# Patient Record
Sex: Female | Born: 1960 | Race: Black or African American | Hispanic: No | Marital: Single | Smoking: Current every day smoker
Health system: Southern US, Community
[De-identification: ages and names within clinical notes are randomized; demographics above are authoritative.]

## PROBLEM LIST (undated history)

## (undated) DIAGNOSIS — E119 Type 2 diabetes mellitus without complications: Secondary | ICD-10-CM

## (undated) DIAGNOSIS — I10 Essential (primary) hypertension: Secondary | ICD-10-CM

## (undated) DIAGNOSIS — K219 Gastro-esophageal reflux disease without esophagitis: Secondary | ICD-10-CM

## (undated) DIAGNOSIS — M1990 Unspecified osteoarthritis, unspecified site: Secondary | ICD-10-CM

## (undated) HISTORY — PX: CHOLECYSTECTOMY: SHX55

## (undated) HISTORY — PX: BACK SURGERY: SHX140

---

## 2014-04-24 NOTE — Progress Notes (Signed)
Stacey Peacerew Perkins, Stacey [name]   -  Please [facility] preop orders in Epic [name] - [facility] on 7/[facility].  Thanks.

## 2014-04-26 ENCOUNTER — Encounter (HOSPITAL_COMMUNITY): Payer: Self-pay | Admitting: Pharmacy Technician

## 2014-04-27 ENCOUNTER — Other Ambulatory Visit: Payer: Self-pay | Admitting: Orthopedic Surgery

## 2014-05-03 ENCOUNTER — Encounter (INDEPENDENT_AMBULATORY_CARE_PROVIDER_SITE_OTHER): Payer: Self-pay

## 2014-05-03 ENCOUNTER — Ambulatory Visit (HOSPITAL_COMMUNITY)
Admission: RE | Admit: 2014-05-03 | Discharge: 2014-05-03 | Disposition: A | Discharge status: Home / Self Care | Payer: Managed Care, Other (non HMO) | Source: Ambulatory Visit | Attending: Orthopedic Surgery | Admitting: Orthopedic Surgery

## 2014-05-03 ENCOUNTER — Encounter (HOSPITAL_COMMUNITY): Payer: Self-pay

## 2014-05-03 ENCOUNTER — Encounter (HOSPITAL_COMMUNITY)
Admission: RE | Admit: 2014-05-03 | Discharge: 2014-05-03 | Disposition: A | Discharge status: Home / Self Care | Payer: Managed Care, Other (non HMO) | Source: Ambulatory Visit | Attending: Orthopedic Surgery | Admitting: Orthopedic Surgery

## 2014-05-03 DIAGNOSIS — Z981 Arthrodesis status: Secondary | ICD-10-CM | POA: Insufficient documentation

## 2014-05-03 DIAGNOSIS — M1610 Unilateral primary osteoarthritis, unspecified hip: Secondary | ICD-10-CM | POA: Insufficient documentation

## 2014-05-03 DIAGNOSIS — Z01818 Encounter for other preprocedural examination: Secondary | ICD-10-CM | POA: Insufficient documentation

## 2014-05-03 DIAGNOSIS — F17200 Nicotine dependence, unspecified, uncomplicated: Secondary | ICD-10-CM | POA: Insufficient documentation

## 2014-05-03 DIAGNOSIS — M169 Osteoarthritis of hip, unspecified: Secondary | ICD-10-CM | POA: Insufficient documentation

## 2014-05-03 DIAGNOSIS — Z01812 Encounter for preprocedural laboratory examination: Secondary | ICD-10-CM | POA: Insufficient documentation

## 2014-05-03 DIAGNOSIS — Z01810 Encounter for preprocedural cardiovascular examination: Secondary | ICD-10-CM | POA: Insufficient documentation

## 2014-05-03 HISTORY — DX: Gastro-esophageal reflux disease without esophagitis: K21.9

## 2014-05-03 HISTORY — DX: Type 2 diabetes mellitus without complications: E11.9

## 2014-05-03 HISTORY — DX: Unspecified osteoarthritis, unspecified site: M19.90

## 2014-05-03 HISTORY — DX: Essential (primary) hypertension: I10

## 2014-05-03 LAB — URINALYSIS, ROUTINE W REFLEX MICROSCOPIC
Bilirubin Urine: NEGATIVE
GLUCOSE, UA: 100 mg/dL — AB
HGB URINE DIPSTICK: NEGATIVE
Ketones, ur: NEGATIVE mg/dL
LEUKOCYTES UA: NEGATIVE
Nitrite: NEGATIVE
Protein, ur: 30 mg/dL — AB
Specific Gravity, Urine: 1.025 (ref 1.005–1.030)
Urobilinogen, UA: 0.2 mg/dL (ref 0.0–1.0)
pH: 7 (ref 5.0–8.0)

## 2014-05-03 LAB — PROTIME-INR
INR: 1.03 (ref 0.00–1.49)
Prothrombin Time: 13.5 seconds (ref 11.6–15.2)

## 2014-05-03 LAB — COMPREHENSIVE METABOLIC PANEL
ALT: 9 U/L (ref 0–35)
AST: 14 U/L (ref 0–37)
Albumin: 3.7 g/dL (ref 3.5–5.2)
Alkaline Phosphatase: 169 U/L — ABNORMAL HIGH (ref 39–117)
Anion gap: 13 (ref 5–15)
BILIRUBIN TOTAL: 0.3 mg/dL (ref 0.3–1.2)
BUN: 10 mg/dL (ref 6–23)
CHLORIDE: 102 meq/L (ref 96–112)
CO2: 24 mEq/L (ref 19–32)
Calcium: 9.5 mg/dL (ref 8.4–10.5)
Creatinine, Ser: 0.68 mg/dL (ref 0.50–1.10)
GFR calc Af Amer: >90 mL/min (ref >90)
GFR calc non Af Amer: >90 mL/min (ref >90)
GLUCOSE: 197 mg/dL — AB (ref 70–99)
Potassium: 3.8 mEq/L (ref 3.7–5.3)
SODIUM: 139 meq/L (ref 137–147)
TOTAL PROTEIN: 8.6 g/dL — AB (ref 6.0–8.3)

## 2014-05-03 LAB — SURGICAL PCR SCREEN
MRSA, PCR: NEGATIVE
Staphylococcus aureus: POSITIVE — AB

## 2014-05-03 LAB — URINE MICROSCOPIC-ADD ON

## 2014-05-03 LAB — CBC
HEMATOCRIT: 37 % (ref 36.0–46.0)
HEMOGLOBIN: 12.8 g/dL (ref 12.0–15.0)
MCH: 29.7 pg (ref 26.0–34.0)
MCHC: 34.6 g/dL (ref 30.0–36.0)
MCV: 85.8 fL (ref 78.0–100.0)
Platelets: 341 10*3/uL (ref 150–400)
RBC: 4.31 MIL/uL (ref 3.87–5.11)
RDW: 14 % (ref 11.5–15.5)
WBC: 6.9 10*3/uL (ref 4.0–10.5)

## 2014-05-03 LAB — APTT: APTT: 26 s (ref 24–37)

## 2014-05-03 NOTE — Patient Instructions (Signed)
Stacey [name]  05/03/2014   Your procedure is scheduled on:  05/10/2014  1145am-130pm  [facility].  Follow the Signs to Short Stay Center at  0945      am  Call this number if you have problems the morning of surgery: [phone]   Remember:   Do not eat food or drink liquids after midnight.   Take these medicines the morning of surgery with A SIP OF WATER:    Do not wear jewelry, make-up or nail polish.  Do not wear lotions, powders, or perfumes. , deodorant   Do not shave 48 hours prior to surgery.   Do not bring valuables to the hospital.  Contacts, dentures or bridgework may not be worn into surgery.  Leave suitcase in the car. After surgery it may be brought to your room.  For patients admitted to the hospital, checkout time is 11:00 AM the day of  discharge.   [place] - Preparing for Surgery Before surgery, you can play an important role.  Because skin is not sterile, your skin needs to be as free of germs as possible.  You can reduce the number of germs on your skin by washing with CHG (chlorahexidine gluconate) soap before surgery.  CHG is an antiseptic cleaner which kills germs and bonds with the skin to continue killing germs even after washing. Please DO NOT use if you have an allergy to CHG or antibacterial soaps.  If your skin becomes reddened/irritated stop using the CHG and inform your nurse when you arrive at Short Stay. Do not shave (including legs and underarms) for at least 48 hours prior to the first CHG shower.  You may shave your face/neck. Please follow these instructions carefully:  1.  Shower with CHG Soap the night before surgery and the  morning of Surgery.  2.  If you choose to wash your hair, wash your hair first as usual with your  normal  shampoo.  3.  After you shampoo, rinse your hair and body thoroughly to remove the  shampoo.                           4.  Use CHG as you would any other liquid soap.  You can apply chg directly  to the  skin and wash                       Gently with a scrungie or clean washcloth.  5.  Apply the CHG Soap to your body ONLY FROM THE NECK DOWN.   Do not use on face/ open                           Wound or open sores. Avoid contact with eyes, ears mouth and genitals (private parts).                       Wash face,  Genitals (private parts) with your normal soap.             6.  Wash thoroughly, paying special attention to the area where your surgery  will be performed.  7.  Thoroughly rinse your body with warm water from the neck down.  8.  DO NOT shower/wash with your normal soap after using and rinsing off  the CHG Soap.  9.  Pat yourself dry with a clean towel.            10.  Wear clean pajamas.            11.  Place clean sheets on your bed the night of your first shower and do not  sleep with pets. Day of Surgery : Do not apply any lotions/deodorants the morning of surgery.  Please wear clean clothes to the hospital/surgery center.  FAILURE TO FOLLOW THESE INSTRUCTIONS MAY RESULT IN THE CANCELLATION OF YOUR SURGERY PATIENT SIGNATURE_________________________________  NURSE SIGNATURE__________________________________  ________________________________________________________________________  WHAT IS A BLOOD TRANSFUSION? Blood Transfusion Information  A transfusion is the replacement of blood or some of its parts. Blood is made up of multiple cells which provide different functions.  Red blood cells carry oxygen and are used for blood loss replacement.  White blood cells fight against infection.  Platelets control bleeding.  Plasma helps clot blood.  Other blood products are available for specialized needs, such as hemophilia or other clotting disorders. BEFORE THE TRANSFUSION  Who gives blood for transfusions?   Healthy volunteers who are fully evaluated to make sure their blood is safe. This is blood bank blood. Transfusion therapy is the safest it has ever been  in the practice of medicine. Before blood is taken from a donor, a complete history is taken to make sure that person has no history of diseases nor engages in risky social behavior (examples are intravenous drug use or sexual activity with multiple partners). The donor's travel history is screened to minimize risk of transmitting infections, such as malaria. The donated blood is tested for signs of infectious diseases, such as HIV and hepatitis. The blood is then tested to be sure it is compatible with you in order to minimize the chance of a transfusion reaction. If you or a relative donates blood, this is often done in anticipation of surgery and is not appropriate for emergency situations. It takes many days to process the donated blood. RISKS AND COMPLICATIONS Although transfusion therapy is very safe and saves many lives, the main dangers of transfusion include:   Getting an infectious disease.  Developing a transfusion reaction. This is an allergic reaction to something in the blood you were given. Every precaution is taken to prevent this. The decision to have a blood transfusion has been considered carefully by your caregiver before blood is given. Blood is not given unless the benefits outweigh the risks. AFTER THE TRANSFUSION  Right after receiving a blood transfusion, you will usually feel much better and more energetic. This is especially true if your red blood cells have gotten low (anemic). The transfusion raises the level of the red blood cells which carry oxygen, and this usually causes an energy increase.  The nurse administering the transfusion will monitor you carefully for complications. HOME CARE INSTRUCTIONS  No special instructions are needed after a transfusion. You may find your energy is better. Speak with your caregiver about any limitations on activity for underlying diseases you may have. SEEK MEDICAL CARE IF:   Your condition is not improving after your  transfusion.  You develop redness or irritation at the intravenous (IV) site. SEEK IMMEDIATE MEDICAL CARE IF:  Any of the following symptoms occur over the next 12 hours:  Shaking chills.  You have a temperature by mouth above 102 F (38.9 C), not controlled by medicine.  Chest, back, or muscle pain.  People around you feel you are not acting correctly or  are confused.  Shortness of breath or difficulty breathing.  Dizziness and fainting.  You get a rash or develop hives.  You have a decrease in urine output.  Your urine turns a dark color or changes to pink, red, or brown. Any of the following symptoms occur over the next 10 days:  You have a temperature by mouth above 102 F (38.9 C), not controlled by medicine.  Shortness of breath.  Weakness after normal activity.  The white part of the eye turns yellow (jaundice).  You have a decrease in the amount of urine or are urinating less often.  Your urine turns a dark color or changes to pink, red, or brown. Document Released: 09/26/2000 Document Revised: 12/22/2011 Document Reviewed: 05/15/2008 ExitCare Patient Information 2014 [place].  _______________________________________________________________________  Incentive Spirometer  An incentive spirometer is a tool that can help keep your lungs clear and active. This tool measures how well you are filling your lungs with each breath. Taking long deep breaths may help reverse or decrease the chance of developing breathing (pulmonary) problems (especially infection) following:  A long period of time when you are unable to move or be active. BEFORE THE PROCEDURE   If the spirometer includes an indicator to show your best effort, your nurse or respiratory therapist will set it to a desired goal.  If possible, sit up straight or lean slightly forward. Try not to slouch.  Hold the incentive spirometer in an upright position. INSTRUCTIONS FOR USE  1. Sit on the  edge of your bed if possible, or sit up as far as you can in bed or on a chair. 2. Hold the incentive spirometer in an upright position. 3. Breathe out normally. 4. Place the mouthpiece in your mouth and seal your lips tightly around it. 5. Breathe in slowly and as deeply as possible, raising the piston or the ball toward the top of the column. 6. Hold your breath for 3-5 seconds or for as long as possible. Allow the piston or ball to fall to the bottom of the column. 7. Remove the mouthpiece from your mouth and breathe out normally. 8. Rest for a few seconds and repeat Steps 1 through 7 at least 10 times every 1-2 hours when you are awake. Take your time and take a few normal breaths between deep breaths. 9. The spirometer may include an indicator to show your best effort. Use the indicator as a goal to work toward during each repetition. 10. After each set of 10 deep breaths, practice coughing to be sure your lungs are clear. If you have an incision (the cut made at the time of surgery), support your incision when coughing by placing a pillow or rolled up towels firmly against it. Once you are able to get out of bed, walk around indoors and cough well. You may stop using the incentive spirometer when instructed by your caregiver.  RISKS AND COMPLICATIONS  Take your time so you do not get dizzy or light-headed.  If you are in pain, you may need to take or ask for pain medication before doing incentive spirometry. It is harder to take a deep breath if you are having pain. AFTER USE  Rest and breathe slowly and easily.  It can be helpful to keep track of a log of your progress. Your caregiver can provide you with a simple table to help with this. If you are using the spirometer at home, follow these instructions: [place] IF:  You are having difficultly using the spirometer.  You have trouble using the spirometer as often as instructed.  Your pain medication is not giving enough  relief while using the spirometer.  You develop fever of 100.5 F (38.1 C) or higher. SEEK IMMEDIATE MEDICAL CARE IF:   You cough up bloody sputum that had not been present before.  You develop fever of 102 F (38.9 C) or greater.  You develop worsening pain at or near the incision site. MAKE SURE YOU:   Understand these instructions.  Will watch your condition.  Will get help right away if you are not doing well or get worse. Document Released: 02/09/2007 Document Revised: 12/22/2011 Document Reviewed: 04/12/2007 ExitCare Patient Information 2014 ExitCare, [place].   ________________________________________________________________________     Please read over the following fact sheets that you were given: MRSA Information, coughing and deep breathing exercises, leg exercises

## 2014-05-03 NOTE — Progress Notes (Signed)
Clearance note on chart from Dr Michel SanteeFavero.

## 2014-05-09 ENCOUNTER — Other Ambulatory Visit: Payer: Self-pay | Admitting: Orthopedic Surgery

## 2014-05-09 NOTE — H&P (Signed)
Stacey [name] DOB: 02-09-1961 Single / Language: [place] / Race: Black or African American, White Female Date of Admission:  05/10/2014 History of Present Illness  The patient is a 53 year old female who comes in for a preoperative history and physical. The patient is scheduled for a right total hip arthroplasty (anterior approach) to be performed by Dr. Gus Rankin. Aluisio, MD at [facility] on 05-10-2014. The patient is a 53 year old female who presents with a hip problem. The patient is here today for a second opinion.The patient reports right hip problems including pain symptoms that have been present for 1 year(s). The symptoms began without any known injury. Prior to being seen today the patient was previously evaluated by a colleague 6 month(s) ago (approximately). Symptoms present at the patient's previous evaluation included pain in the hip. Previous workup for this problem has included hip x-rays. Note for "Hip problem": She said she was told she needed a total hip replacement, so she decided to come here for another opinion. Stacey [name] comes in  for a second opinion for her right hip and thigh pain. She states her pain started gradual aobut one year ago. It has been progressive in nature which brought her to her first evaluation. She was told at that time she would need a total hip replacement. She wanted a second opinion and was referred to out clinic by a friend of a friend that is a patient here. The hip has been getting wores with time forcing her to compensate over to her left leg. The pain is noted in the thigh and groin region. She feels some popping at times when she is up on it. She will occassionlay get some numbness but not all the time. She finds that it is more difficult walking and staying on her feet for the whole day while at work. 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 She describes more difficulty getting up  and down steps, getting in and out of the car, and putting on shoes and socks noting some loss of motion in the hip. She experiences sedintary stiffness and it takes her several steps to get moving after sitting for a while. She has pain when up walking but also pain at rest and pain at night. She has used ice and heat with some benefit. She will get some partial relief with oral ibuprofen. She was seen as a second opinion patient. The hip is now hurting her at all times. It is limiting what she can and cannot do. She still works in a standing position for long hours on a hard surface. It is getting harder to work because of the pain. She has pain that keeps her awake. She has had significant limitations in function. She cannot do what she desires. All of this has gotten much worse over the past year. She did not have any injury which has led to this. She is ready to proceed with the hip replacment. They have been treated conservatively in the past for the above stated problem and despite conservative measures, they continue to have progressive pain and severe functional limitations and dysfunction. They have failed non-operative management including home exercise, medications. It is felt that they would benefit from undergoing total joint replacement. Risks and benefits of the procedure have been discussed with the patient and they elect to proceed with surgery. There are no active  contraindications to surgery such as ongoing infection or rapidly progressive neurological disease.   Allergies  No Known Drug Allergies  Problem List/Past Medical Osteoarthritis of right hip, unspecified osteoarthritis type (715.95  M16.11) Rheumatoid Arthritis High blood pressure Hypercholesterolemia Peripheral Neuropathy Diabetes Mellitus, Type II Gastroesophageal Reflux Disease Hemorrhoids Menopause   Family History  Cancer Maternal Grandmother. Cerebrovascular Accident Mother. Hypertension  Mother, Sister. Rheumatoid Arthritis Father, Mother.  Social History Exercise Exercises daily; does running / walking Living situation live alone Marital status single Current work status working full time Children 1 Current drinker 01/26/2014: Currently drinks wine only occasionally per week Tobacco use Current every day smoker. 01/26/2014: smoke(d) 1/2 pack(s) per day Tobacco / smoke exposure 01/26/2014: yes outdoors only No history of drug/alcohol rehab Not under pain contract Number of flights of stairs before winded 2-3  Medication History  GlipiZIDE (Oral) Specific dose unknown - Active. MetFORMIN HCl (Oral) Specific dose unknown - Active. Simvastatin (Oral) Specific dose unknown - Active. Quinapril-Hydrochlorothiazide (Oral) Specific dose unknown - Active. Potassium Chloride (Oral) Specific dose unknown - Active. Magnesium Chloride (Oral) Specific dose unknown - Active. Aspirin Low Strength (81MG  Tablet Chewable, Oral) Active. Ibuprofen (200MG  Tablet, Oral) Active.   Past Surgical History Tubal Ligation Cesarean Delivery Date: 12/1985. 1 time Gallbladder Surgery laporoscopic Spinal Surgery Date: 01/2011.   Review of Systems General Present- Fatigue and Night Sweats. Not Present- Chills, Fever, Memory Loss, Weight Gain and Weight Loss. Skin Not Present- Eczema, Hives, Itching, Lesions and Rash. HEENT Not Present- Dentures, Double Vision, Headache, Hearing Loss, Tinnitus and Visual Loss. Respiratory Not Present- Allergies, Chronic Cough, Coughing up blood, Shortness of breath at rest and Shortness of breath with exertion. Cardiovascular Not Present- Chest Pain, Difficulty Breathing Lying Down, Murmur, Palpitations, Racing/skipping heartbeats and Swelling. Gastrointestinal Not Present- Abdominal Pain, Bloody Stool, Constipation, Diarrhea, Difficulty Swallowing, Heartburn, Jaundice, Loss of appetitie, Nausea and Vomiting. Female Genitourinary Not  Present- Blood in Urine, Discharge, Flank Pain, Incontinence, Painful Urination, Urgency, Urinary frequency, Urinary Retention, Urinating at Night and Weak urinary stream. Musculoskeletal Present- Joint Pain, Morning Stiffness and Muscle Pain. Not Present- Back Pain, Joint Swelling, Muscle Weakness and Spasms. Neurological Not Present- Blackout spells, Difficulty with balance, Dizziness, Paralysis, Tremor and Weakness. Psychiatric Not Present- Insomnia.   Vitals Weight: 204 lb Height: 64in Weight was reported by patient. Height was reported by patient. Body Surface Area: 2.04 m Body Mass Index: 35.02 kg/m Pulse: 72 (Regular)  BP: 132/78 (Sitting, Right Arm, Standard)    Physical Exam  General Mental Status -Alert, cooperative and good historian. General Appearance-pleasant, Not in acute distress. Orientation-Oriented X3. Build & Nutrition-Well nourished and Well developed.  Head and Neck Head-normocephalic, atraumatic . Neck Global Assessment - supple, no bruit auscultated on the right, no bruit auscultated on the left. Note: upper and lower dentures   Eye Vision-Wears corrective lenses. Pupil - Bilateral-PERR Motion - Bilateral-EOMI.  Chest and Lung Exam Auscultation Breath sounds - clear at anterior chest wall and clear at posterior chest wall. Adventitious sounds - No Adventitious sounds.  Cardiovascular Auscultation Rhythm - Regular rate and rhythm. Heart Sounds - S1 WNL and S2 WNL. Murmurs & Other Heart Sounds - Auscultation of the heart reveals - No Murmurs.  Abdomen Palpation/Percussion Tenderness - Abdomen is non-tender to palpation. Rigidity (guarding) - Abdomen is soft. Auscultation Auscultation of the abdomen reveals - Bowel sounds normal.  Female Genitourinary Note: Not done, not pertinent to present illness   Musculoskeletal Note: Musculoskeletal: Left hip shows flexion to 120, rotation in 20,  out 30, abduction 30  without discomfort. Right hip flexion 90. No internal rotation, about 10 degrees of external rotation, 10 to 20 degrees of abduction.  Extremities: Knee exams are normal. Pulse, sensation, and motor intact both lower extremities. Gait pattern is significantly antalgic on the right.  X-RAYS: AP pelvis and lateral of the right hip shows that she has severe bone on bone change of the right hip with absolutely no joint space left with some subchondral cyst formation and osteophyte formation.  Her left hip has some mild to moderate degenerative changes but not fully bone on bone.  Assessment & Plan Osteoarthritis of right hip,(715.95  M16.11) Note:Plan is for a Right Total Hip Replacement - Anterior Approach by Dr. Lequita Halt.  Plan is to go to rehab. Will get the Social Worker to assist with placement following the procedure.  PCP - Dr. Michel Santee - [facility] [place]  The patient does not have any contraindications and will receive TXA (tranexamic acid) prior to surgery.  Signed electronically by Beckey Rutter, III PA-C

## 2014-05-10 ENCOUNTER — Encounter (HOSPITAL_COMMUNITY): Payer: Self-pay | Admitting: *Deleted

## 2014-05-10 ENCOUNTER — Encounter (HOSPITAL_COMMUNITY)
Admission: RE | Disposition: A | Discharge status: Home Health | Payer: Self-pay | Source: Ambulatory Visit | Attending: Orthopedic Surgery

## 2014-05-10 ENCOUNTER — Inpatient Hospital Stay (HOSPITAL_COMMUNITY): Payer: Managed Care, Other (non HMO)

## 2014-05-10 ENCOUNTER — Encounter (HOSPITAL_COMMUNITY): Payer: Managed Care, Other (non HMO) | Admitting: Certified Registered Nurse Anesthetist

## 2014-05-10 ENCOUNTER — Inpatient Hospital Stay (HOSPITAL_COMMUNITY): Payer: Managed Care, Other (non HMO) | Admitting: Certified Registered Nurse Anesthetist

## 2014-05-10 ENCOUNTER — Inpatient Hospital Stay (HOSPITAL_COMMUNITY)
Admission: RE | Admit: 2014-05-10 | Discharge: 2014-05-12 | DRG: 470 | Disposition: A | Discharge status: Home Health | Payer: Managed Care, Other (non HMO) | Source: Ambulatory Visit | Attending: Orthopedic Surgery | Admitting: Orthopedic Surgery

## 2014-05-10 DIAGNOSIS — Z823 Family history of stroke: Secondary | ICD-10-CM

## 2014-05-10 DIAGNOSIS — E7800 Pure hypercholesterolemia, unspecified: Secondary | ICD-10-CM | POA: Diagnosis present

## 2014-05-10 DIAGNOSIS — M1610 Unilateral primary osteoarthritis, unspecified hip: Principal | ICD-10-CM | POA: Diagnosis present

## 2014-05-10 DIAGNOSIS — Z8261 Family history of arthritis: Secondary | ICD-10-CM

## 2014-05-10 DIAGNOSIS — E1149 Type 2 diabetes mellitus with other diabetic neurological complication: Secondary | ICD-10-CM | POA: Diagnosis present

## 2014-05-10 DIAGNOSIS — E1142 Type 2 diabetes mellitus with diabetic polyneuropathy: Secondary | ICD-10-CM | POA: Diagnosis present

## 2014-05-10 DIAGNOSIS — M1611 Unilateral primary osteoarthritis, right hip: Secondary | ICD-10-CM

## 2014-05-10 DIAGNOSIS — I10 Essential (primary) hypertension: Secondary | ICD-10-CM | POA: Diagnosis present

## 2014-05-10 DIAGNOSIS — F17200 Nicotine dependence, unspecified, uncomplicated: Secondary | ICD-10-CM | POA: Diagnosis present

## 2014-05-10 DIAGNOSIS — D62 Acute posthemorrhagic anemia: Secondary | ICD-10-CM | POA: Diagnosis not present

## 2014-05-10 DIAGNOSIS — E119 Type 2 diabetes mellitus without complications: Secondary | ICD-10-CM | POA: Diagnosis present

## 2014-05-10 DIAGNOSIS — M76899 Other specified enthesopathies of unspecified lower limb, excluding foot: Secondary | ICD-10-CM | POA: Diagnosis present

## 2014-05-10 DIAGNOSIS — Z8249 Family history of ischemic heart disease and other diseases of the circulatory system: Secondary | ICD-10-CM

## 2014-05-10 DIAGNOSIS — M169 Osteoarthritis of hip, unspecified: Secondary | ICD-10-CM

## 2014-05-10 DIAGNOSIS — Z96641 Presence of right artificial hip joint: Secondary | ICD-10-CM

## 2014-05-10 DIAGNOSIS — K219 Gastro-esophageal reflux disease without esophagitis: Secondary | ICD-10-CM | POA: Diagnosis present

## 2014-05-10 HISTORY — PX: TOTAL HIP ARTHROPLASTY: SHX124

## 2014-05-10 LAB — ABO/RH: ABO/RH(D): B POS

## 2014-05-10 LAB — TYPE AND SCREEN
ABO/RH(D): B POS
Antibody Screen: NEGATIVE

## 2014-05-10 LAB — GLUCOSE, CAPILLARY
GLUCOSE-CAPILLARY: 113 mg/dL — AB (ref 70–99)
Glucose-Capillary: 214 mg/dL — ABNORMAL HIGH (ref 70–99)
Glucose-Capillary: 219 mg/dL — ABNORMAL HIGH (ref 70–99)
Glucose-Capillary: 314 mg/dL — ABNORMAL HIGH (ref 70–99)

## 2014-05-10 SURGERY — ARTHROPLASTY, HIP, TOTAL, ANTERIOR APPROACH
Anesthesia: Spinal | Site: Hip | Laterality: Right

## 2014-05-10 MED ORDER — METOCLOPRAMIDE HCL 5 MG/ML IJ SOLN
5.0000 mg | Freq: Three times a day (TID) | INTRAMUSCULAR | Status: DC | PRN
Start: 2014-05-10 — End: 2014-05-12

## 2014-05-10 MED ORDER — BUPIVACAINE HCL (PF) 0.25 % IJ SOLN
INTRAMUSCULAR | Status: DC | PRN
  Administered 2014-05-10: 20 mL

## 2014-05-10 MED ORDER — BUPIVACAINE HCL (PF) 0.5 % IJ SOLN
INTRAMUSCULAR | Status: DC | PRN
  Administered 2014-05-10: 3 mL

## 2014-05-10 MED ORDER — NICOTINE 14 MG/24HR TD PT24
14.0000 mg | MEDICATED_PATCH | Freq: Every day | TRANSDERMAL | Status: DC
  Administered 2014-05-10 – 2014-05-12 (×4): 14 mg via TRANSDERMAL
  Filled 2014-05-10 (×3): qty 1

## 2014-05-10 MED ORDER — PHENYLEPHRINE HCL 10 MG/ML IJ SOLN
INTRAMUSCULAR | Status: DC | PRN
  Administered 2014-05-10 (×2): 80 ug via INTRAVENOUS
  Administered 2014-05-10: 40 ug via INTRAVENOUS
  Administered 2014-05-10: 80 ug via INTRAVENOUS
  Administered 2014-05-10: 40 ug via INTRAVENOUS
  Administered 2014-05-10 (×4): 80 ug via INTRAVENOUS

## 2014-05-10 MED ORDER — MAGNESIUM OXIDE 400 (241.3 MG) MG PO TABS
800.0000 mg | ORAL_TABLET | Freq: Two times a day (BID) | ORAL | Status: DC
  Administered 2014-05-10 – 2014-05-12 (×4): 800 mg via ORAL
  Filled 2014-05-10 (×5): qty 2

## 2014-05-10 MED ORDER — ONDANSETRON HCL 4 MG/2ML IJ SOLN
INTRAMUSCULAR | Status: DC | PRN
  Administered 2014-05-10: 4 mg via INTRAVENOUS

## 2014-05-10 MED ORDER — MEPERIDINE HCL 50 MG/ML IJ SOLN: 6.2500 mg | INTRAMUSCULAR | Status: DC | PRN

## 2014-05-10 MED ORDER — LIDOCAINE HCL (CARDIAC) 20 MG/ML IV SOLN
INTRAVENOUS | Status: DC | PRN
  Administered 2014-05-10: 20 mg via INTRAVENOUS

## 2014-05-10 MED ORDER — MENTHOL 3 MG MT LOZG: 1.0000 | LOZENGE | OROMUCOSAL | Status: DC | PRN

## 2014-05-10 MED ORDER — CEFAZOLIN SODIUM-DEXTROSE 2-3 GM-% IV SOLR
2.0000 g | Freq: Four times a day (QID) | INTRAVENOUS | Status: AC
  Administered 2014-05-10 (×2): 2 g via INTRAVENOUS
  Filled 2014-05-10 (×2): qty 50

## 2014-05-10 MED ORDER — PROPOFOL 10 MG/ML IV BOLUS
INTRAVENOUS | Status: AC
  Filled 2014-05-10: qty 20

## 2014-05-10 MED ORDER — FLEET ENEMA 7-19 GM/118ML RE ENEM: 1.0000 | ENEMA | Freq: Once | RECTAL | Status: AC | PRN

## 2014-05-10 MED ORDER — DEXAMETHASONE SODIUM PHOSPHATE 10 MG/ML IJ SOLN
10.0000 mg | Freq: Once | INTRAMUSCULAR | Status: AC
  Administered 2014-05-10: 10 mg via INTRAVENOUS

## 2014-05-10 MED ORDER — MORPHINE SULFATE 2 MG/ML IJ SOLN
1.0000 mg | INTRAMUSCULAR | Status: DC | PRN
  Administered 2014-05-10: 2 mg via INTRAVENOUS
  Filled 2014-05-10: qty 1

## 2014-05-10 MED ORDER — ONDANSETRON HCL 4 MG PO TABS: 4.0000 mg | ORAL_TABLET | Freq: Four times a day (QID) | ORAL | Status: DC | PRN

## 2014-05-10 MED ORDER — POTASSIUM CHLORIDE IN NACL 20-0.9 MEQ/L-% IV SOLN
INTRAVENOUS | Status: DC
  Administered 2014-05-10: 75 mL/h via INTRAVENOUS
  Administered 2014-05-11: 10:00:00 via INTRAVENOUS
  Filled 2014-05-10 (×2): qty 1000

## 2014-05-10 MED ORDER — MIDAZOLAM HCL 2 MG/2ML IJ SOLN
INTRAMUSCULAR | Status: AC
  Filled 2014-05-10: qty 2

## 2014-05-10 MED ORDER — CEFAZOLIN SODIUM-DEXTROSE 2-3 GM-% IV SOLR
INTRAVENOUS | Status: AC
  Filled 2014-05-10: qty 50

## 2014-05-10 MED ORDER — ACETAMINOPHEN 10 MG/ML IV SOLN
1000.0000 mg | Freq: Once | INTRAVENOUS | Status: AC
  Administered 2014-05-10: 1000 mg via INTRAVENOUS
  Filled 2014-05-10: qty 100

## 2014-05-10 MED ORDER — SIMVASTATIN 20 MG PO TABS
20.0000 mg | ORAL_TABLET | Freq: Every day | ORAL | Status: DC
Start: 2014-05-10 — End: 2014-05-12
  Administered 2014-05-10 – 2014-05-11 (×2): 20 mg via ORAL
  Filled 2014-05-10 (×3): qty 1

## 2014-05-10 MED ORDER — BISACODYL 10 MG RE SUPP: 10.0000 mg | Freq: Every day | RECTAL | Status: DC | PRN

## 2014-05-10 MED ORDER — ACETAMINOPHEN 650 MG RE SUPP: 650.0000 mg | Freq: Four times a day (QID) | RECTAL | Status: DC | PRN

## 2014-05-10 MED ORDER — SODIUM CHLORIDE 0.9 % IJ SOLN
INTRAMUSCULAR | Status: AC
  Filled 2014-05-10: qty 10

## 2014-05-10 MED ORDER — HYDROMORPHONE HCL PF 1 MG/ML IJ SOLN: 0.2500 mg | INTRAMUSCULAR | Status: DC | PRN

## 2014-05-10 MED ORDER — EPHEDRINE SULFATE 50 MG/ML IJ SOLN
INTRAMUSCULAR | Status: DC | PRN
  Administered 2014-05-10 (×3): 5 mg via INTRAVENOUS

## 2014-05-10 MED ORDER — ONDANSETRON HCL 4 MG/2ML IJ SOLN
INTRAMUSCULAR | Status: AC
  Filled 2014-05-10: qty 2

## 2014-05-10 MED ORDER — CEFAZOLIN SODIUM-DEXTROSE 2-3 GM-% IV SOLR
2.0000 g | INTRAVENOUS | Status: AC
  Administered 2014-05-10: 2 g via INTRAVENOUS

## 2014-05-10 MED ORDER — DIPHENHYDRAMINE HCL 12.5 MG/5ML PO ELIX: 12.5000 mg | ORAL_SOLUTION | ORAL | Status: DC | PRN

## 2014-05-10 MED ORDER — METHOCARBAMOL 1000 MG/10ML IJ SOLN
500.0000 mg | Freq: Four times a day (QID) | INTRAVENOUS | Status: DC | PRN
  Administered 2014-05-10: 500 mg via INTRAVENOUS
  Filled 2014-05-10: qty 5

## 2014-05-10 MED ORDER — LACTATED RINGERS IV SOLN: INTRAVENOUS | Status: DC

## 2014-05-10 MED ORDER — FENTANYL CITRATE 0.05 MG/ML IJ SOLN
INTRAMUSCULAR | Status: AC
  Filled 2014-05-10: qty 5

## 2014-05-10 MED ORDER — METFORMIN HCL 500 MG PO TABS
500.0000 mg | ORAL_TABLET | Freq: Every day | ORAL | Status: DC
  Filled 2014-05-10 (×2): qty 1

## 2014-05-10 MED ORDER — ACETAMINOPHEN 325 MG PO TABS: 650.0000 mg | ORAL_TABLET | Freq: Four times a day (QID) | ORAL | Status: DC | PRN

## 2014-05-10 MED ORDER — PHENYLEPHRINE 40 MCG/ML (10ML) SYRINGE FOR IV PUSH (FOR BLOOD PRESSURE SUPPORT)
PREFILLED_SYRINGE | INTRAVENOUS | Status: AC
  Filled 2014-05-10: qty 20

## 2014-05-10 MED ORDER — 0.9 % SODIUM CHLORIDE (POUR BTL) OPTIME
TOPICAL | Status: DC | PRN
  Administered 2014-05-10: 1000 mL

## 2014-05-10 MED ORDER — DEXAMETHASONE SODIUM PHOSPHATE 10 MG/ML IJ SOLN
10.0000 mg | Freq: Every day | INTRAMUSCULAR | Status: AC
  Filled 2014-05-10: qty 1

## 2014-05-10 MED ORDER — POLYETHYLENE GLYCOL 3350 17 G PO PACK: 17.0000 g | PACK | Freq: Every day | ORAL | Status: DC | PRN

## 2014-05-10 MED ORDER — PROMETHAZINE HCL 25 MG/ML IJ SOLN: 6.2500 mg | INTRAMUSCULAR | Status: DC | PRN

## 2014-05-10 MED ORDER — PROPOFOL INFUSION 10 MG/ML OPTIME
INTRAVENOUS | Status: DC | PRN
  Administered 2014-05-10: 25 ug/kg/min via INTRAVENOUS

## 2014-05-10 MED ORDER — CHLORHEXIDINE GLUCONATE 4 % EX LIQD: 60.0000 mL | Freq: Once | CUTANEOUS | Status: DC

## 2014-05-10 MED ORDER — SODIUM CHLORIDE 0.9 % IV SOLN: INTRAVENOUS | Status: DC

## 2014-05-10 MED ORDER — METOCLOPRAMIDE HCL 10 MG PO TABS: 5.0000 mg | ORAL_TABLET | Freq: Three times a day (TID) | ORAL | Status: DC | PRN

## 2014-05-10 MED ORDER — TRANEXAMIC ACID 100 MG/ML IV SOLN
1000.0000 mg | INTRAVENOUS | Status: AC
  Administered 2014-05-10: 1000 mg via INTRAVENOUS
  Filled 2014-05-10: qty 10

## 2014-05-10 MED ORDER — DOCUSATE SODIUM 100 MG PO CAPS
100.0000 mg | ORAL_CAPSULE | Freq: Two times a day (BID) | ORAL | Status: DC
  Administered 2014-05-10 – 2014-05-12 (×4): 100 mg via ORAL

## 2014-05-10 MED ORDER — OXYCODONE HCL 5 MG PO TABS
5.0000 mg | ORAL_TABLET | ORAL | Status: DC | PRN
  Administered 2014-05-10 – 2014-05-11 (×5): 10 mg via ORAL
  Filled 2014-05-10 (×6): qty 2

## 2014-05-10 MED ORDER — BUPIVACAINE LIPOSOME 1.3 % IJ SUSP
INTRAMUSCULAR | Status: DC | PRN
  Administered 2014-05-10: 20 mL

## 2014-05-10 MED ORDER — DEXAMETHASONE 6 MG PO TABS
10.0000 mg | ORAL_TABLET | Freq: Every day | ORAL | Status: AC
  Administered 2014-05-11: 10 mg via ORAL
  Filled 2014-05-10: qty 1

## 2014-05-10 MED ORDER — FENTANYL CITRATE 0.05 MG/ML IJ SOLN
INTRAMUSCULAR | Status: DC | PRN
  Administered 2014-05-10: 50 ug via INTRAVENOUS

## 2014-05-10 MED ORDER — SODIUM CHLORIDE 0.9 % IJ SOLN
INTRAMUSCULAR | Status: DC | PRN
Start: 2014-05-10 — End: 2014-05-10
  Administered 2014-05-10: 30 mL

## 2014-05-10 MED ORDER — BUPIVACAINE LIPOSOME 1.3 % IJ SUSP
20.0000 mL | Freq: Once | INTRAMUSCULAR | Status: DC
  Filled 2014-05-10: qty 20

## 2014-05-10 MED ORDER — ESMOLOL HCL 10 MG/ML IV SOLN
INTRAVENOUS | Status: DC | PRN
  Administered 2014-05-10: 20 mg via INTRAVENOUS

## 2014-05-10 MED ORDER — ESMOLOL HCL 10 MG/ML IV SOLN
INTRAVENOUS | Status: AC
  Filled 2014-05-10: qty 10

## 2014-05-10 MED ORDER — GLIPIZIDE 5 MG PO TABS
5.0000 mg | ORAL_TABLET | Freq: Every day | ORAL | Status: DC
  Administered 2014-05-11 – 2014-05-12 (×2): 5 mg via ORAL
  Filled 2014-05-10 (×3): qty 1

## 2014-05-10 MED ORDER — PHENYLEPHRINE 40 MCG/ML (10ML) SYRINGE FOR IV PUSH (FOR BLOOD PRESSURE SUPPORT)
PREFILLED_SYRINGE | INTRAVENOUS | Status: AC
  Filled 2014-05-10: qty 10

## 2014-05-10 MED ORDER — TRAMADOL HCL 50 MG PO TABS
50.0000 mg | ORAL_TABLET | Freq: Four times a day (QID) | ORAL | Status: DC | PRN
  Administered 2014-05-11 – 2014-05-12 (×3): 50 mg via ORAL
  Filled 2014-05-10 (×3): qty 1

## 2014-05-10 MED ORDER — PROPOFOL 10 MG/ML IV BOLUS
INTRAVENOUS | Status: DC | PRN
  Administered 2014-05-10 (×2): 20 mg via INTRAVENOUS

## 2014-05-10 MED ORDER — LIDOCAINE HCL (CARDIAC) 20 MG/ML IV SOLN
INTRAVENOUS | Status: AC
  Filled 2014-05-10: qty 5

## 2014-05-10 MED ORDER — POTASSIUM GLUCONATE 595 (99 K) MG PO TABS
1190.0000 mg | ORAL_TABLET | Freq: Two times a day (BID) | ORAL | Status: DC
  Administered 2014-05-10 – 2014-05-12 (×4): 1190 mg via ORAL
  Filled 2014-05-10 (×5): qty 2

## 2014-05-10 MED ORDER — EPHEDRINE SULFATE 50 MG/ML IJ SOLN
INTRAMUSCULAR | Status: AC
  Filled 2014-05-10: qty 1

## 2014-05-10 MED ORDER — METHOCARBAMOL 500 MG PO TABS
500.0000 mg | ORAL_TABLET | Freq: Four times a day (QID) | ORAL | Status: DC | PRN
  Administered 2014-05-10 – 2014-05-12 (×4): 500 mg via ORAL
  Filled 2014-05-10 (×4): qty 1

## 2014-05-10 MED ORDER — STERILE WATER FOR IRRIGATION IR SOLN
Status: DC | PRN
  Administered 2014-05-10: 1500 mL

## 2014-05-10 MED ORDER — RIVAROXABAN 10 MG PO TABS
10.0000 mg | ORAL_TABLET | Freq: Every day | ORAL | Status: DC
  Administered 2014-05-11 – 2014-05-12 (×2): 10 mg via ORAL
  Filled 2014-05-10 (×3): qty 1

## 2014-05-10 MED ORDER — LACTATED RINGERS IV SOLN
INTRAVENOUS | Status: DC
Start: 2014-05-10 — End: 2014-05-10
  Administered 2014-05-10: 1000 mL via INTRAVENOUS
  Administered 2014-05-10 (×2): via INTRAVENOUS

## 2014-05-10 MED ORDER — SODIUM CHLORIDE 0.9 % IJ SOLN
INTRAMUSCULAR | Status: AC
  Filled 2014-05-10: qty 50

## 2014-05-10 MED ORDER — ACETAMINOPHEN 500 MG PO TABS
1000.0000 mg | ORAL_TABLET | Freq: Four times a day (QID) | ORAL | Status: AC
  Administered 2014-05-10 – 2014-05-11 (×4): 1000 mg via ORAL
  Filled 2014-05-10 (×5): qty 2

## 2014-05-10 MED ORDER — ONDANSETRON HCL 4 MG/2ML IJ SOLN: 4.0000 mg | Freq: Four times a day (QID) | INTRAMUSCULAR | Status: DC | PRN

## 2014-05-10 MED ORDER — BUPIVACAINE HCL (PF) 0.25 % IJ SOLN
INTRAMUSCULAR | Status: AC
  Filled 2014-05-10: qty 30

## 2014-05-10 MED ORDER — INSULIN ASPART 100 UNIT/ML ~~LOC~~ SOLN
0.0000 [IU] | Freq: Three times a day (TID) | SUBCUTANEOUS | Status: DC
  Administered 2014-05-10 – 2014-05-11 (×2): 5 [IU] via SUBCUTANEOUS
  Administered 2014-05-11: 3 [IU] via SUBCUTANEOUS
  Administered 2014-05-11: 5 [IU] via SUBCUTANEOUS
  Administered 2014-05-12: 2 [IU] via SUBCUTANEOUS
  Administered 2014-05-12: 5 [IU] via SUBCUTANEOUS

## 2014-05-10 MED ORDER — PHENOL 1.4 % MT LIQD: 1.0000 | OROMUCOSAL | Status: DC | PRN

## 2014-05-10 MED ORDER — MIDAZOLAM HCL 5 MG/5ML IJ SOLN
INTRAMUSCULAR | Status: DC | PRN
  Administered 2014-05-10: 2 mg via INTRAVENOUS

## 2014-05-10 MED ORDER — DEXAMETHASONE SODIUM PHOSPHATE 10 MG/ML IJ SOLN
INTRAMUSCULAR | Status: AC
  Filled 2014-05-10: qty 1

## 2014-05-10 SURGICAL SUPPLY — 40 items
BAG ZIPLOCK 12X15 (MISCELLANEOUS) IMPLANT
BLADE EXTENDED COATED 6.5IN (ELECTRODE) ×3 IMPLANT
BLADE SAG 18X100X1.27 (BLADE) ×3 IMPLANT
CAPT HIP PF COP ×3 IMPLANT
CLOSURE WOUND 1/2 X4 (GAUZE/BANDAGES/DRESSINGS) ×2
COVER PERINEAL POST (MISCELLANEOUS) ×3 IMPLANT
DECANTER SPIKE VIAL GLASS SM (MISCELLANEOUS) ×3 IMPLANT
DRAPE C-ARM 42X120 X-RAY (DRAPES) ×3 IMPLANT
DRAPE STERI IOBAN 125X83 (DRAPES) ×3 IMPLANT
DRAPE U-SHAPE 47X51 STRL (DRAPES) ×9 IMPLANT
DRSG ADAPTIC 3X8 NADH LF (GAUZE/BANDAGES/DRESSINGS) ×3 IMPLANT
DRSG MEPILEX BORDER 4X4 (GAUZE/BANDAGES/DRESSINGS) ×3 IMPLANT
DRSG MEPILEX BORDER 4X8 (GAUZE/BANDAGES/DRESSINGS) ×3 IMPLANT
DURAPREP 26ML APPLICATOR (WOUND CARE) ×3 IMPLANT
ELECT REM PT RETURN 9FT ADLT (ELECTROSURGICAL) ×3
ELECTRODE REM PT RTRN 9FT ADLT (ELECTROSURGICAL) ×1 IMPLANT
EVACUATOR 1/8 PVC DRAIN (DRAIN) ×3 IMPLANT
FACESHIELD WRAPAROUND (MASK) ×12 IMPLANT
GAUZE SPONGE 4X4 12PLY STRL (GAUZE/BANDAGES/DRESSINGS) IMPLANT
GLOVE BIO SURGEON STRL SZ7.5 (GLOVE) ×3 IMPLANT
GLOVE BIO SURGEON STRL SZ8 (GLOVE) ×6 IMPLANT
GLOVE BIOGEL PI IND STRL 8 (GLOVE) ×2 IMPLANT
GLOVE BIOGEL PI INDICATOR 8 (GLOVE) ×4
GOWN STRL REUS W/TWL LRG LVL3 (GOWN DISPOSABLE) ×3 IMPLANT
GOWN STRL REUS W/TWL XL LVL3 (GOWN DISPOSABLE) ×3 IMPLANT
KIT BASIN OR (CUSTOM PROCEDURE TRAY) ×3 IMPLANT
NDL SAFETY ECLIPSE 18X1.5 (NEEDLE) ×2 IMPLANT
NEEDLE HYPO 18GX1.5 SHARP (NEEDLE) ×4
PACK TOTAL JOINT (CUSTOM PROCEDURE TRAY) ×3 IMPLANT
STRIP CLOSURE SKIN 1/2X4 (GAUZE/BANDAGES/DRESSINGS) ×4 IMPLANT
SUCTION FRAZIER 12FR DISP (SUCTIONS) IMPLANT
SUT ETHIBOND NAB CT1 #1 30IN (SUTURE) ×3 IMPLANT
SUT MNCRL AB 4-0 PS2 18 (SUTURE) ×3 IMPLANT
SUT VIC AB 2-0 CT1 27 (SUTURE) ×4
SUT VIC AB 2-0 CT1 TAPERPNT 27 (SUTURE) ×2 IMPLANT
SUT VLOC 180 0 24IN GS25 (SUTURE) ×3 IMPLANT
SYRINGE 20CC LL (MISCELLANEOUS) ×3 IMPLANT
SYRINGE 60CC LL (MISCELLANEOUS) ×3 IMPLANT
TOWEL OR 17X26 10 PK STRL BLUE (TOWEL DISPOSABLE) ×3 IMPLANT
TRAY FOLEY CATH 14FRSI W/METER (CATHETERS) ×3 IMPLANT

## 2014-05-10 NOTE — H&P (View-Only) (Signed)
Stacey [name] DOB: 02-09-1961 Single / Language: [place] / Race: Black or African American, White Female Date of Admission:  05/10/2014 History of Present Illness  The patient is a 53 year old female who comes in for a preoperative history and physical. The patient is scheduled for a right total hip arthroplasty (anterior approach) to be performed by Dr. Gus Rankin. Aluisio, MD at [facility] on 05-10-2014. The patient is a 53 year old female who presents with a hip problem. The patient is here today for a second opinion.The patient reports right hip problems including pain symptoms that have been present for 1 year(s). The symptoms began without any known injury. Prior to being seen today the patient was previously evaluated by a colleague 6 month(s) ago (approximately). Symptoms present at the patient's previous evaluation included pain in the hip. Previous workup for this problem has included hip x-rays. Note for "Hip problem": She said she was told she needed a total hip replacement, so she decided to come here for another opinion. Stacey [name] comes in  for a second opinion for her right hip and thigh pain. She states her pain started gradual aobut one year ago. It has been progressive in nature which brought her to her first evaluation. She was told at that time she would need a total hip replacement. She wanted a second opinion and was referred to out clinic by a friend of a friend that is a patient here. The hip has been getting wores with time forcing her to compensate over to her left leg. The pain is noted in the thigh and groin region. She feels some popping at times when she is up on it. She will occassionlay get some numbness but not all the time. She finds that it is more difficult walking and staying on her feet for the whole day while at work. 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 She describes more difficulty getting up  and down steps, getting in and out of the car, and putting on shoes and socks noting some loss of motion in the hip. She experiences sedintary stiffness and it takes her several steps to get moving after sitting for a while. She has pain when up walking but also pain at rest and pain at night. She has used ice and heat with some benefit. She will get some partial relief with oral ibuprofen. She was seen as a second opinion patient. The hip is now hurting her at all times. It is limiting what she can and cannot do. She still works in a standing position for long hours on a hard surface. It is getting harder to work because of the pain. She has pain that keeps her awake. She has had significant limitations in function. She cannot do what she desires. All of this has gotten much worse over the past year. She did not have any injury which has led to this. She is ready to proceed with the hip replacment. They have been treated conservatively in the past for the above stated problem and despite conservative measures, they continue to have progressive pain and severe functional limitations and dysfunction. They have failed non-operative management including home exercise, medications. It is felt that they would benefit from undergoing total joint replacement. Risks and benefits of the procedure have been discussed with the patient and they elect to proceed with surgery. There are no active  contraindications to surgery such as ongoing infection or rapidly progressive neurological disease.   Allergies  No Known Drug Allergies  Problem List/Past Medical Osteoarthritis of right hip, unspecified osteoarthritis type (715.95  M16.11) Rheumatoid Arthritis High blood pressure Hypercholesterolemia Peripheral Neuropathy Diabetes Mellitus, Type II Gastroesophageal Reflux Disease Hemorrhoids Menopause   Family History  Cancer Maternal Grandmother. Cerebrovascular Accident Mother. Hypertension  Mother, Sister. Rheumatoid Arthritis Father, Mother.  Social History Exercise Exercises daily; does running / walking Living situation live alone Marital status single Current work status working full time Children 1 Current drinker 01/26/2014: Currently drinks wine only occasionally per week Tobacco use Current every day smoker. 01/26/2014: smoke(d) 1/2 pack(s) per day Tobacco / smoke exposure 01/26/2014: yes outdoors only No history of drug/alcohol rehab Not under pain contract Number of flights of stairs before winded 2-3  Medication History  GlipiZIDE (Oral) Specific dose unknown - Active. MetFORMIN HCl (Oral) Specific dose unknown - Active. Simvastatin (Oral) Specific dose unknown - Active. Quinapril-Hydrochlorothiazide (Oral) Specific dose unknown - Active. Potassium Chloride (Oral) Specific dose unknown - Active. Magnesium Chloride (Oral) Specific dose unknown - Active. Aspirin Low Strength (81MG  Tablet Chewable, Oral) Active. Ibuprofen (200MG  Tablet, Oral) Active.   Past Surgical History Tubal Ligation Cesarean Delivery Date: 12/1985. 1 time Gallbladder Surgery laporoscopic Spinal Surgery Date: 01/2011.   Review of Systems General Present- Fatigue and Night Sweats. Not Present- Chills, Fever, Memory Loss, Weight Gain and Weight Loss. Skin Not Present- Eczema, Hives, Itching, Lesions and Rash. HEENT Not Present- Dentures, Double Vision, Headache, Hearing Loss, Tinnitus and Visual Loss. Respiratory Not Present- Allergies, Chronic Cough, Coughing up blood, Shortness of breath at rest and Shortness of breath with exertion. Cardiovascular Not Present- Chest Pain, Difficulty Breathing Lying Down, Murmur, Palpitations, Racing/skipping heartbeats and Swelling. Gastrointestinal Not Present- Abdominal Pain, Bloody Stool, Constipation, Diarrhea, Difficulty Swallowing, Heartburn, Jaundice, Loss of appetitie, Nausea and Vomiting. Female Genitourinary Not  Present- Blood in Urine, Discharge, Flank Pain, Incontinence, Painful Urination, Urgency, Urinary frequency, Urinary Retention, Urinating at Night and Weak urinary stream. Musculoskeletal Present- Joint Pain, Morning Stiffness and Muscle Pain. Not Present- Back Pain, Joint Swelling, Muscle Weakness and Spasms. Neurological Not Present- Blackout spells, Difficulty with balance, Dizziness, Paralysis, Tremor and Weakness. Psychiatric Not Present- Insomnia.   Vitals Weight: 204 lb Height: 64in Weight was reported by patient. Height was reported by patient. Body Surface Area: 2.04 m Body Mass Index: 35.02 kg/m Pulse: 72 (Regular)  BP: 132/78 (Sitting, Right Arm, Standard)    Physical Exam  General Mental Status -Alert, cooperative and good historian. General Appearance-pleasant, Not in acute distress. Orientation-Oriented X3. Build & Nutrition-Well nourished and Well developed.  Head and Neck Head-normocephalic, atraumatic . Neck Global Assessment - supple, no bruit auscultated on the right, no bruit auscultated on the left. Note: upper and lower dentures   Eye Vision-Wears corrective lenses. Pupil - Bilateral-PERR Motion - Bilateral-EOMI.  Chest and Lung Exam Auscultation Breath sounds - clear at anterior chest wall and clear at posterior chest wall. Adventitious sounds - No Adventitious sounds.  Cardiovascular Auscultation Rhythm - Regular rate and rhythm. Heart Sounds - S1 WNL and S2 WNL. Murmurs & Other Heart Sounds - Auscultation of the heart reveals - No Murmurs.  Abdomen Palpation/Percussion Tenderness - Abdomen is non-tender to palpation. Rigidity (guarding) - Abdomen is soft. Auscultation Auscultation of the abdomen reveals - Bowel sounds normal.  Female Genitourinary Note: Not done, not pertinent to present illness   Musculoskeletal Note: Musculoskeletal: Left hip shows flexion to 120, rotation in 20,  out 30, abduction 30  without discomfort. Right hip flexion 90. No internal rotation, about 10 degrees of external rotation, 10 to 20 degrees of abduction.  Extremities: Knee exams are normal. Pulse, sensation, and motor intact both lower extremities. Gait pattern is significantly antalgic on the right.  X-RAYS: AP pelvis and lateral of the right hip shows that she has severe bone on bone change of the right hip with absolutely no joint space left with some subchondral cyst formation and osteophyte formation.  Her left hip has some mild to moderate degenerative changes but not fully bone on bone.  Assessment & Plan Osteoarthritis of right hip,(715.95  M16.11) Note:Plan is for a Right Total Hip Replacement - Anterior Approach by Dr. Lequita Halt.  Plan is to go to rehab. Will get the Social Worker to assist with placement following the procedure.  PCP - Dr. Michel Santee - [facility], [place]  The patient does not have any contraindications and will receive TXA (tranexamic acid) prior to surgery.  Signed electronically by Beckey Rutter, III PA-C

## 2014-05-10 NOTE — Transfer of Care (Signed)
Immediate Anesthesia Transfer of Care Note  Patient: Stacey [name]  Procedure(s) Performed: Procedure(s) (LRB): RIGHT TOTAL HIP ARTHROPLASTY ANTERIOR APPROACH (Right)  Patient Location: PACU  Anesthesia Type: Spinal  Level of Consciousness: sedated, patient cooperative and responds to stimulation  Airway & Oxygen Therapy: Patient Spontanous Breathing and Patient connected to face mask oxgen  Post-op Assessment: Report given to PACU RN and Post -op Vital signs reviewed and stable  Post vital signs: Reviewed and stable  Complications: No apparent anesthesia complications

## 2014-05-10 NOTE — Anesthesia Postprocedure Evaluation (Signed)
  Anesthesia Post-op Note  Patient: Stacey [name]  Procedure(s) Performed: Procedure(s) (LRB): RIGHT TOTAL HIP ARTHROPLASTY ANTERIOR APPROACH (Right)  Patient Location: PACU  Anesthesia Type: Spinal  Level of Consciousness: awake and alert   Airway and Oxygen Therapy: Patient Spontanous Breathing  Post-op Pain: mild  Post-op Assessment: Post-op Vital signs reviewed, Patient's Cardiovascular Status Stable, Respiratory Function Stable, Patent Airway and No signs of Nausea or vomiting  Last Vitals:  Filed Vitals:   05/10/14 1540  BP: 130/74  Pulse: 68  Temp: 36.9 C  Resp: 16    Post-op Vital Signs: stable   Complications: No apparent anesthesia complications

## 2014-05-10 NOTE — Op Note (Signed)
OPERATIVE REPORT  PREOPERATIVE DIAGNOSIS: Osteoarthritis of the Right hip.   POSTOPERATIVE DIAGNOSIS: Osteoarthritis of the Right  hip.   PROCEDURE: Right total hip arthroplasty, anterior approach.   SURGEON: Ollen Gross, MD   ASSISTANT: Avel Peace, PA-C  ANESTHESIA:  Spinal  ESTIMATED BLOOD LOSS:-100 ml  DRAINS: Hemovac x1.   COMPLICATIONS: None   CONDITION: PACU - hemodynamically stable.   BRIEF CLINICAL NOTE: Stacey [name] is a 53 y.o. female who has advanced end-  stage arthritis of his Right  hip with progressively worsening pain and  dysfunction.The patient has failed nonoperative management and presents for  total hip arthroplasty.   PROCEDURE IN DETAIL: After successful administration of spinal  anesthetic, the traction boots for the [facility] bed were placed on both  feet and the patient was placed onto the [facility] bed, boots placed into the leg  holders. The Right hip was then isolated from the perineum with plastic  drapes and prepped and draped in the usual sterile fashion. ASIS and  greater trochanter were marked and a oblique incision was made, starting  at about 1 cm lateral and 2 cm distal to the ASIS and coursing towards  the anterior cortex of the femur. The skin was cut with a 10 blade  through subcutaneous tissue to the level of the fascia overlying the  tensor fascia lata muscle. The fascia was then incised in line with the  incision at the junction of the anterior third and posterior 2/3rd. The  muscle was teased off the fascia and then the interval between the TFL  and the rectus was developed. The Hohmann retractor was then placed at  the top of the femoral neck over the capsule. The vessels overlying the  capsule were cauterized and the fat on top of the capsule was removed.  A Hohmann retractor was then placed anterior underneath the rectus  femoris to give exposure to the entire anterior capsule. A T-shaped  capsulotomy was performed. The  edges were tagged and the femoral head  was identified.       Osteophytes are removed off the superior acetabulum.  The femoral neck was then cut in situ with an oscillating saw. Traction  was then applied to the left lower extremity utilizing the [facility]  traction. The femoral head was then removed. Retractors were placed  around the acetabulum and then circumferential removal of the labrum was  performed. Osteophytes were also removed. Reaming starts at 43 mm to  medialize and  Increased in 2 mm increments to 47 mm. We reamed in  approximately 40 degrees of abduction, 20 degrees anteversion. A 48 mm  pinnacle acetabular shell was then impacted in anatomic position under  fluoroscopic guidance with excellent purchase. We did not need to place  any additional dome screws. A 28 mm neutral + 4 marathon liner was then  placed into the acetabular shell.       The femoral lift was then placed along the lateral aspect of the femur  just distal to the vastus ridge. The leg was  externally rotated and capsule  was stripped off the inferior aspect of the femoral neck down to the  level of the lesser trochanter, this was done with electrocautery. The femur was lifted after this was performed. The  leg was then placed and extended in adducted position to essentially delivering the femur. We also removed the capsule superiorly and the  piriformis from the piriformis fossa to  gain excellent exposure of the  proximal femur. Rongeur was used to remove some cancellous bone to get  into the lateral portion of the proximal femur for placement of the  initial starter reamer. The starter broaches was placed  the starter broach  and was shown to go down the center of the canal. Broaching  with the  Corail system was then performed starting at size 8, coursing  Up to size 9. A size 9 had excellent torsional and rotational  and axial stability. The trial standard offset neck was then placed  with a 28 + 1.5 trial  head. The hip was then reduced. We confirmed that  the stem was in the canal both on AP and lateral x-rays. It also has excellent sizing. The hip was reduced with outstanding stability through full extension, full external rotation,  and then flexion in adduction internal rotation. AP pelvis was taken  and the leg lengths were measured and found to be exactly equal. Hip  was then dislocated again and the femoral head and neck removed. The  femoral broach was removed. Size 9 Corail stem with a standard offset  neck was then impacted into the femur following native anteversion. Has  excellent purchase in the canal. Excellent torsional and rotational and  axial stability. It is confirmed to be in the canal on AP and lateral  fluoroscopic views. The 28 + 1.5 ceramic head was placed and the hip  reduced with outstanding stability. Again AP pelvis was taken and it  confirmed that the leg lengths were equal. The wound was then copiously  irrigated with saline solution and the capsule reattached and repaired  with Ethibond suture.  20 mL of Exparel mixed with 50 mL of saline then additional 20 ml of .25% Bupivicaine injected into the capsule and into the edge of the tensor fascia lata as well as subcutaneous tissue. The fascia overlying the tensor fascia lata was  then closed with a running #1 V-Loc. Subcu was closed with interrupted  2-0 Vicryl and subcuticular running 4-0 Monocryl. Incision was cleaned  and dried. Steri-Strips and a bulky sterile dressing applied. Hemovac  drain was hooked to suction and then he was awakened and transported to  recovery in stable condition.        Please note that a surgical assistant was a medical necessity for this procedure to perform it in a safe and expeditious manner. Assistant was necessary to provide appropriate retraction of vital neurovascular structures and to prevent femoral fracture and allow for anatomic placement of the prosthesis.  Ollen GrossFrank Conny Situ, M.D.

## 2014-05-10 NOTE — Anesthesia Procedure Notes (Signed)
Spinal  Start time: 05/10/2014 12:22 PM End time: 05/10/2014 12:27 PM Staffing CRNA/Resident: Lyda KalataJARVELA, Luria Rosario R Performed by: anesthesiologist and resident/CRNA  Preanesthetic Checklist Completed: patient identified, site marked, surgical consent, pre-op evaluation, timeout performed, IV checked, risks and benefits discussed and monitors and equipment checked Spinal Block Patient position: sitting Prep: Betasept Patient monitoring: heart rate, cardiac monitor, continuous pulse ox and blood pressure Approach: midline Location: L2-3 Injection technique: single-shot Needle Needle gauge: 24 G Needle length: 9 cm Needle insertion depth: 8 cm Assessment Sensory level: T4

## 2014-05-10 NOTE — Anesthesia Preprocedure Evaluation (Addendum)
Anesthesia Evaluation  Patient identified by MRN, date of birth, ID band Patient awake    Reviewed: Allergy & Precautions, H&P , NPO status , Patient's Chart, lab work & pertinent test results  Airway Mallampati: II TM Distance: >3 FB Neck ROM: Full    Dental no notable dental hx.    Pulmonary neg pulmonary ROS, Current Smoker,  breath sounds clear to auscultation  Pulmonary exam normal       Cardiovascular hypertension, Pt. on medications Rhythm:Regular Rate:Normal     Neuro/Psych negative neurological ROS  negative psych ROS   GI/Hepatic negative GI ROS, Neg liver ROS,   Endo/Other  diabetes, Type 2, Oral Hypoglycemic Agents  Renal/GU negative Renal ROS  negative genitourinary   Musculoskeletal negative musculoskeletal ROS (+)   Abdominal   Peds negative pediatric ROS (+)  Hematology negative hematology ROS (+)   Anesthesia Other Findings   Reproductive/Obstetrics negative OB ROS                          Anesthesia Physical Anesthesia Plan  ASA: III  Anesthesia Plan: Spinal   Post-op Pain Management:    Induction: Intravenous  Airway Management Planned: Simple Face Mask  Additional Equipment:   Intra-op Plan:   Post-operative Plan:   Informed Consent: I have reviewed the patients History and Physical, chart, labs and discussed the procedure including the risks, benefits and alternatives for the proposed anesthesia with the patient or authorized representative who has indicated his/her understanding and acceptance.   Dental advisory given  Plan Discussed with: CRNA  Anesthesia Plan Comments:         Anesthesia Quick Evaluation

## 2014-05-10 NOTE — Interval H&P Note (Signed)
History and Physical Interval Note:  05/10/2014 12:09 PM  Stacey [name]  has presented today for surgery, with the diagnosis of OA RIGHT HIP  The various methods of treatment have been discussed with the patient and family. After consideration of risks, benefits and other options for treatment, the patient has consented to  Procedure(s): RIGHT TOTAL HIP ARTHROPLASTY ANTERIOR APPROACH (Right) as a surgical intervention .  The patient's history has been reviewed, patient examined, no change in status, stable for surgery.  I have reviewed the patient's chart and labs.  Questions were answered to the patient's satisfaction.     Loanne DrillingALUISIO,[name]

## 2014-05-11 DIAGNOSIS — D62 Acute posthemorrhagic anemia: Secondary | ICD-10-CM | POA: Diagnosis not present

## 2014-05-11 DIAGNOSIS — E119 Type 2 diabetes mellitus without complications: Secondary | ICD-10-CM | POA: Diagnosis present

## 2014-05-11 DIAGNOSIS — I10 Essential (primary) hypertension: Secondary | ICD-10-CM | POA: Diagnosis present

## 2014-05-11 DIAGNOSIS — K219 Gastro-esophageal reflux disease without esophagitis: Secondary | ICD-10-CM | POA: Diagnosis present

## 2014-05-11 LAB — BASIC METABOLIC PANEL
ANION GAP: 12 (ref 5–15)
BUN: 10 mg/dL (ref 6–23)
CALCIUM: 8.5 mg/dL (ref 8.4–10.5)
CO2: 20 meq/L (ref 19–32)
CREATININE: 0.66 mg/dL (ref 0.50–1.10)
Chloride: 105 mEq/L (ref 96–112)
GFR calc Af Amer: >90 mL/min (ref >90)
GFR calc non Af Amer: >90 mL/min (ref >90)
Glucose, Bld: 273 mg/dL — ABNORMAL HIGH (ref 70–99)
Potassium: 4.1 mEq/L (ref 3.7–5.3)
Sodium: 137 mEq/L (ref 137–147)

## 2014-05-11 LAB — GLUCOSE, CAPILLARY
GLUCOSE-CAPILLARY: 217 mg/dL — AB (ref 70–99)
Glucose-Capillary: 229 mg/dL — ABNORMAL HIGH (ref 70–99)
Glucose-Capillary: 176 mg/dL — ABNORMAL HIGH (ref 70–99)
Glucose-Capillary: 208 mg/dL — ABNORMAL HIGH (ref 70–99)

## 2014-05-11 LAB — CBC
HEMATOCRIT: 29.9 % — AB (ref 36.0–46.0)
Hemoglobin: 10.5 g/dL — ABNORMAL LOW (ref 12.0–15.0)
MCH: 29.7 pg (ref 26.0–34.0)
MCHC: 35.1 g/dL (ref 30.0–36.0)
MCV: 84.5 fL (ref 78.0–100.0)
PLATELETS: 275 10*3/uL (ref 150–400)
RBC: 3.54 MIL/uL — ABNORMAL LOW (ref 3.87–5.11)
RDW: 13.7 % (ref 11.5–15.5)
WBC: 10.4 10*3/uL (ref 4.0–10.5)

## 2014-05-11 NOTE — Progress Notes (Signed)
   Subjective: 1 Day Post-Op Procedure(s) (LRB): RIGHT TOTAL HIP ARTHROPLASTY ANTERIOR APPROACH (Right) Patient reports pain as mild and moderate.   Patient seen in rounds with Dr. Aluisio.  Tough night lasLequita Haltt night but better today. Patient is well, and has had no acute complaints or problems this morning. We will start therapy today.  Plan is to go Home after hospital stay.  Objective: Vital signs in last 24 hours: Temp:  [97.3 F (36.3 C)-98.5 F (36.9 C)] 97.6 F (36.4 C) (07/30 0605) Pulse Rate:  [63-90] 70 (07/30 0605) Resp:  [14-20] 16 (07/30 0605) BP: (111-150)/(51-85) 124/72 mmHg (07/30 0605) SpO2:  [100 %] 100 % (07/30 0605) Weight:  [86.183 kg (190 lb)] 86.183 kg (190 lb) (07/29 1540)  Intake/Output from previous day:  Intake/Output Summary (Last 24 hours) at 05/11/14 0801 Last data filed at 05/11/14 [id number]  Gross per 24 hour  Intake   3590 ml  Output   2235 ml  Net   1355 ml    Intake/Output this shift:    Labs:  Recent Labs  05/11/14 0443  HGB 10.5*    Recent Labs  05/11/14 0443  WBC 10.4  RBC 3.54*  HCT 29.9*  PLT 275    Recent Labs  05/11/14 0443  NA 137  K 4.1  CL 105  CO2 20  BUN 10  CREATININE 0.66  GLUCOSE 273*  CALCIUM 8.5   No results found for this basename: LABPT, INR,  in the last 72 hours  EXAM General - Patient is Alert, Appropriate and Oriented Extremity - Neurovascular intact Sensation intact distally Dorsiflexion/Plantar flexion intact Dressing - dressing C/D/I Motor Function - intact, moving foot and toes well on exam.  Hemovac pulled without difficulty.  Past Medical History  Diagnosis Date  . Hypertension   . Diabetes mellitus without complication   . Arthritis   . GERD (gastroesophageal reflux disease)     hx of     Assessment/Plan: 1 Day Post-Op Procedure(s) (LRB): RIGHT TOTAL HIP ARTHROPLASTY ANTERIOR APPROACH (Right) Principal Problem:   OA (osteoarthritis) of hip Active Problems:   Postoperative  anemia due to acute blood loss   Unspecified essential hypertension   GERD (gastroesophageal reflux disease)   Diabetes  Estimated body mass index is 33.67 kg/(m^2) as calculated from the following:   Height as of this encounter: 5\' 3"  (1.6 m).   Weight as of this encounter: 86.183 kg (190 lb). Advance diet Up with therapy Discharge home with home health  DVT Prophylaxis - Xarelto Weight Bearing As Tolerated right Leg Hemovac Pulled Begin Therapy  Avel Peacerew Teresina Bugaj, PA-C Orthopaedic Surgery 05/11/2014, 8:01 AM

## 2014-05-11 NOTE — Progress Notes (Signed)
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 Hx of RA, HTN, DM, peripheral neuropathy.     PT Comments    Progressing with mobility.   Follow Up Recommendations  Home health PT;Supervision/Assistance - 24 hour     Equipment Recommendations  Rolling walker with 5" wheels    Recommendations for Other Services OT consult     Precautions / Restrictions Precautions Precautions: Fall Restrictions Weight Bearing Restrictions: No RLE Weight Bearing: Weight bearing as tolerated    Mobility  Bed Mobility   Bed Mobility: Sit to Supine     Supine to sit: Min assist Sit to supine: Min assist      Transfers Overall transfer level: Needs assistance Equipment used: Rolling walker (2 wheeled) Transfers: Sit to/from Stand Sit to Stand: Min guard         General transfer comment: close guard for safety.   Ambulation/Gait Ambulation/Gait assistance: Min guard Ambulation Distance (Feet): 125 Feet Assistive device: Rolling walker (2 wheeled) Gait Pattern/deviations: Step-through pattern;Decreased stride length;Decreased step length - left;Wide base of support     General Gait Details: close guard for safety. tolerated well.    Stairs            Wheelchair Mobility    Modified Rankin (Stroke Patients Only)       Balance                                    Cognition Arousal/Alertness: Awake/alert Behavior During Therapy: WFL for tasks assessed/performed Overall Cognitive Status: Within Functional Limits for tasks assessed                      Exercises Total Joint Exercises Ankle Circles/Pumps: AROM;Both;10 reps;Supine Quad Sets: AROM;Both;10 reps;Supine Heel Slides: AAROM;Right;10 reps;Supine Hip ABduction/ADduction: AAROM;Right;10 reps;Supine    General Comments General comments  (skin integrity, edema, etc.): min assist standing at the sink to groom      Pertinent Vitals/Pain 5/10 R hip. Ice applied end of session    Home Living Family/patient expects to be discharged to:: Private residence Living Arrangements: Alone   Type of Home: House Home Access: Ramped entrance   Home Layout: One level Home Equipment: None Additional Comments: pt states her sister can obtain a tubseat for her likely    Prior Function Level of Independence: Independent          PT Goals (current goals can now be found in the care plan section) Acute Rehab PT Goals Patient Stated Goal: home with assistance Progress towards PT goals: Progressing toward goals    Frequency  7X/week    PT Plan Current plan remains appropriate    Co-evaluation             End of Session Equipment Utilized During Treatment: Gait belt Activity Tolerance: Patient tolerated treatment well Patient left: in bed;with call bell/phone within reach     Time: 1437-1456 PT Time Calculation (min): 19 min  Charges:  $Gait Training: 8-22 mins                    G Codes:      Rebeca AlertJannie Billy Rocco, MPT Pager: [phone]

## 2014-05-11 NOTE — Plan of Care (Signed)
Problem: Consults Goal: Diagnosis- Total Joint Replacement Outcome: Completed/Met Date Met:  05/11/14 Primary Total Hip RIGHT, Anterior

## 2014-05-11 NOTE — Discharge Instructions (Addendum)
°Dr. Frank Aluisio °Total Joint Specialist °[facility] °[address] °[place], [place] [address] °[phone] ° ° ° °ANTERIOR APPROACH TOTAL HIP REPLACEMENT POSTOPERATIVE DIRECTIONS ° ° °Hip Rehabilitation, Guidelines Following Surgery  °The results of a hip operation are greatly improved after range of motion and muscle strengthening exercises. Follow all safety measures which are given to protect your hip. If any of these exercises cause increased pain or swelling in your joint, decrease the amount until you are comfortable again. Then slowly increase the exercises. Call your caregiver if you have problems or questions.  °HOME CARE INSTRUCTIONS  °Most of the following instructions are designed to prevent the dislocation of your new hip.  °Remove items at home which could result in a fall. This includes throw rugs or furniture in walking pathways.  °Continue medications as instructed at time of discharge. °· You may have some home medications which will be placed on hold until you complete the course of blood thinner medication. °· You may start showering once you are discharged home but do not submerge the incision under water. Just pat the incision dry and apply a dry gauze dressing on daily. °Do not put on socks or shoes without following the instructions of your caregivers.  °Sit on high chairs which makes it easier to stand.  °Sit on chairs with arms. Use the chair arms to help push yourself up when arising.  °Keep your leg on the side of the operation out in front of you when standing up.  °Arrange for the use of a toilet seat elevator so you are not sitting low.   °· Walk with walker as instructed.  °You may resume a sexual relationship in one month or when given the OK by your caregiver.  °Use walker as long as suggested by your caregivers.  °You may put full weight on your legs and walk as much as is comfortable. °Avoid periods of inactivity such as sitting longer than an hour  when not asleep. This helps prevent blood clots.  °You may return to work once you are cleared by your surgeon.  °Do not drive a car for 6 weeks or until released by your surgeon.  °Do not drive while taking narcotics.  °Wear elastic stockings for three weeks following surgery during the day but you may remove then at night.  °Make sure you keep all of your appointments after your operation with all of your doctors and caregivers. You should call the office at the above phone number and make an appointment for approximately two weeks after the date of your surgery. °Change the dressing daily and reapply a dry dressing each time. °Please pick up a stool softener and laxative for home use as long as you are requiring pain medications. °· Continue to use ice on the hip for pain and swelling from surgery. You may notice swelling that will progress down to the foot and ankle.  This is normal after  surgery.  Elevate the leg when you are not up walking on it.   °It is important for you to complete the blood thinner medication as prescribed by your doctor. °· Continue to use the breathing machine which will help keep your temperature down.  It is common for your temperature to cycle up and down following surgery, especially at night when you are not up moving around and exerting yourself.  The breathing machine keeps your lungs expanded and your temperature down. ° °RANGE OF MOTION AND STRENGTHENING EXERCISES  °  These exercises are designed to help you keep full movement of your hip joint. Follow your caregiver's or physical therapist's instructions. Perform all exercises about fifteen times, three times per day or as directed. Exercise both hips, even if you have had only one joint replacement. These exercises can be done on a training (exercise) mat, on the floor, on a table or on a bed. Use whatever works the best and is most comfortable for you. Use music or television while you are exercising so that the exercises are  a pleasant break in your day. This will make your life better with the exercises acting as a break in routine you can look forward to.  °Lying on your back, slowly slide your foot toward your buttocks, raising your knee up off the floor. Then slowly slide your foot back down until your leg is straight again.  °Lying on your back spread your legs as far apart as you can without causing discomfort.  °Lying on your side, raise your upper leg and foot straight up from the floor as far as is comfortable. Slowly lower the leg and repeat.  °Lying on your back, tighten up the muscle in the front of your thigh (quadriceps muscles). You can do this by keeping your leg straight and trying to raise your heel off the floor. This helps strengthen the largest muscle supporting your knee.  °Lying on your back, tighten up the muscles of your buttocks both with the legs straight and with the knee bent at a comfortable angle while keeping your heel on the floor.  ° °SKILLED REHAB INSTRUCTIONS: °If the patient is transferred to a skilled rehab facility following release from the hospital, a list of the current medications will be sent to the facility for the patient to continue.  When discharged from the skilled rehab facility, please have the facility set up the patient's Home Health Physical Therapy prior to being released. Also, the skilled facility will be responsible for providing the patient with their medications at time of release from the facility to include their pain medication, the muscle relaxants, and their blood thinner medication. If the patient is still at the rehab facility at time of the two week follow up appointment, the skilled rehab facility will also need to assist the patient in arranging follow up appointment in our office and any transportation needs. ° °MAKE SURE YOU:  °Understand these instructions.  °Will watch your condition.  °Will get help right away if you are not doing well or get worse. ° °Pick up  stool softner and laxative for home. °Do not submerge incision under water. °May shower. °Continue to use ice for pain and swelling from surgery. °Total Hip Protocol. ° °Take Xarelto for two and a half more weeks, then discontinue Xarelto. °Once the patient has completed the blood thinner regimen, then take a Baby 81 mg Aspirin daily for three more weeks. ° °Information on my medicine - XARELTO® (Rivaroxaban) ° °This medication education was reviewed with me or my healthcare representative as part of my discharge preparation.  The pharmacist that spoke with me during my hospital stay was:  Jackson, Rachel E, RPH ° °Why was Xarelto® prescribed for you? °Xarelto® was prescribed for you to reduce the risk of blood clots forming after orthopedic surgery. The medical term for these abnormal blood clots is venous thromboembolism (VTE). ° °What do you need to know about xarelto® ? °Take your Xarelto® ONCE DAILY at the same time every day. °You may   take it either with or without food. ° °If you have difficulty swallowing the tablet whole, you may crush it and mix in applesauce just prior to taking your dose. ° °Take Xarelto® exactly as prescribed by your doctor and DO NOT stop taking Xarelto® without talking to the doctor who prescribed the medication.  Stopping without other VTE prevention medication to take the place of Xarelto® may increase your risk of developing a clot. ° °After discharge, you should have regular check-up appointments with your healthcare provider that is prescribing your Xarelto®.   ° °What do you do if you miss a dose? °If you miss a dose, take it as soon as you remember on the same day then continue your regularly scheduled once daily regimen the next day. Do not take two doses of Xarelto® on the same day.  ° °Important Safety Information °A possible side effect of Xarelto® is bleeding. You should call your healthcare provider right away if you experience any of the following: °  Bleeding from an  injury or your nose that does not stop. °  Unusual colored urine (red or dark brown) or unusual colored stools (red or black). °  Unusual bruising for unknown reasons. °  A serious fall or if you hit your head (even if there is no bleeding). ° °Some medicines may interact with Xarelto® and might increase your risk of bleeding while on Xarelto®. To help avoid this, consult your healthcare provider or pharmacist prior to using any new prescription or non-prescription medications, including herbals, vitamins, non-steroidal anti-inflammatory drugs (NSAIDs) and supplements. ° °This website has more information on Xarelto®: www.xarelto.com. ° ° °

## 2014-05-11 NOTE — Evaluation (Signed)
Physical Therapy Evaluation Patient Details Name: Stacey [name] MRN: [id number] DOB: [birth year] Today's Date: 05/11/2014   History of Present Illness  53 yo female s/p R THA-direct anterior 05/10/14. Hx of RA, HTN, DM, peripheral neuropathy.   Clinical Impression  On eval, pt required Min assist for mobility-able to ambulate 15'x2-distance limited by dizziness. Pt lives alone-states she is arranging for assist at home.     Follow Up Recommendations Home health PT;Supervision/Assistance - 24 hour    Equipment Recommendations  Rolling walker with 5" wheels    Recommendations for Other Services OT consult     Precautions / Restrictions Precautions Precautions: Fall Restrictions Weight Bearing Restrictions: No RLE Weight Bearing: Weight bearing as tolerated      Mobility  Bed Mobility Overal bed mobility: Needs Assistance Bed Mobility: Supine to Sit     Supine to sit: Min assist     General bed mobility comments: assist for R LE  Transfers Overall transfer level: Needs assistance Equipment used: Rolling walker (2 wheeled) Transfers: Sit to/from Stand Sit to Stand: Min assist         General transfer comment: assist to rise, stabilize, control descent. vcs safety, technique, hand placement.   Ambulation/Gait Ambulation/Gait assistance: Min assist Ambulation Distance (Feet): 15 Feet (x2) Assistive device: Rolling walker (2 wheeled) Gait Pattern/deviations: Step-to pattern;Decreased stride length;Antalgic;Wide base of support     General Gait Details: assist to stabilize. pt c/o dizziness so distance limited during this session.   Stairs            Wheelchair Mobility    Modified Rankin (Stroke Patients Only)       Balance                                             Pertinent Vitals/Pain 5/10 R hip with activity. 136/82 BP sitting at end of session     Home Living Family/patient expects to be discharged to:: Private  residence Living Arrangements: Alone   Type of Home: House Home Access: Ramped entrance     Home Layout: One level        Prior Function Level of Independence: Independent               Hand Dominance        Extremity/Trunk Assessment   Upper Extremity Assessment: Defer to OT evaluation           Lower Extremity Assessment: RLE deficits/detail RLE Deficits / Details: hip abd/add 2/5, hip flex 2/5, moves ankle well    Cervical / Trunk Assessment: Normal  Communication   Communication: No difficulties  Cognition Arousal/Alertness: Awake/alert Behavior During Therapy: WFL for tasks assessed/performed Overall Cognitive Status: Within Functional Limits for tasks assessed                      General Comments      Exercises        Assessment/Plan    PT Assessment Patient needs continued PT services  PT Diagnosis Difficulty walking;Generalized weakness;Acute pain   PT Problem List Decreased strength;Decreased range of motion;Decreased activity tolerance;Decreased balance;Decreased mobility;Pain;Decreased knowledge of use of DME  PT Treatment Interventions DME instruction;Gait training;Functional mobility training;Therapeutic activities;Balance training;Patient/family education   PT Goals (Current goals can be found in the Care Plan section) Acute Rehab PT Goals Patient Stated Goal: home with assistance PT Goal Formulation: With  patient Time For Goal Achievement: 05/18/14 Potential to Achieve Goals: Good    Frequency 7X/week   Barriers to discharge        Co-evaluation               End of Session Equipment Utilized During Treatment: Gait belt Activity Tolerance: Patient tolerated treatment well Patient left: in chair;with call bell/phone within reach           Time: 0940-1003 PT Time Calculation (min): 23 min   Charges:   PT Evaluation $Initial PT Evaluation Tier I: 1 Procedure PT Treatments $Gait Training: 8-22  mins $Therapeutic Activity: 8-22 mins   PT G Codes:          Rebeca AlertJannie Patricie Geeslin, MPT Pager: [phone]

## 2014-05-11 NOTE — Progress Notes (Addendum)
If patient wishes to go to home versus snf will need [facility] in [place].  Rolling walker ordered and Celene SkeenLecretica Williamson with [facility] notified. and does not want a 3 in 1 bsc. Anola GurneyRhonda Davis,Rn,BSn,CCM [phone]

## 2014-05-11 NOTE — Progress Notes (Addendum)
[facility] the [facility]: RW and Commode  If patient discharges after hours, please call [phone].   Renard HamperLecretia Williamson 05/11/2014, 11:12 AM

## 2014-05-11 NOTE — Evaluation (Signed)
Occupational Therapy Evaluation Patient Details Name: Stacey [name] MRN: [id number] DOB: [birth year] Today's Date: 05/11/2014    History of Present Illness 53 yo female s/p R THA-direct anterior 05/10/14. Hx of RA, HTN, DM, peripheral neuropathy.    Clinical Impression   Pt did well with toilet transfer during session, needing min assist for safety. No complaint of dizziness. Educated on AE options and tub DME options as well. Will benefit from continued OT services to progress ADL independence.    Follow Up Recommendations  No OT follow up;Supervision/Assistance - 24 hour    Equipment Recommendations  3 in 1 bedside comode    Recommendations for Other Services       Precautions / Restrictions Precautions Precautions: Fall Restrictions Weight Bearing Restrictions: No RLE Weight Bearing: Weight bearing as tolerated      Mobility Bed Mobility             Transfers Overall transfer level: Needs assistance Equipment used: Rolling walker (2 wheeled) Transfers: Sit to/from Stand Sit to Stand: Min assist         General transfer comment: min assist to steady and verbal cues hand placement    Balance                                            ADL Overall ADL's : Needs assistance/impaired Eating/Feeding: Independent;Sitting   Grooming: Wash/dry hands;Minimal assistance;Standing   Upper Body Bathing: Set up;Sitting   Lower Body Bathing: Moderate assistance;Sit to/from stand   Upper Body Dressing : Set up;Sitting   Lower Body Dressing: Moderate assistance;Sit to/from stand   Toilet Transfer: Minimal assistance;Ambulation;BSC;RW   Toileting- Clothing Manipulation and Hygiene: Minimal assistance;Sit to/from stand         General ADL Comments: Discussed AE options for LB self care but pt is able to reach to the top of her R foot currently so feel she will progress to be able to don sock, pants over R LE hopefully soon. She states her family can  help with this at home.  Discussed tub DMe options of bench versus tubseat  and she states her sister can probably obtain a seat for her and she will check. Pt with no dizziness during session.      Vision                     Perception     Praxis      Pertinent Vitals/Pain 5/10 R hip; reposition, rest     Hand Dominance     Extremity/Trunk Assessment Upper Extremity Assessment Upper Extremity Assessment: Overall WFL for tasks assessed      Cervical / Trunk Assessment Cervical / Trunk Assessment: Normal   Communication Communication Communication: No difficulties   Cognition Arousal/Alertness: Awake/alert Behavior During Therapy: WFL for tasks assessed/performed Overall Cognitive Status: Within Functional Limits for tasks assessed                     General Comments       Exercises       Shoulder Instructions      Home Living Family/patient expects to be discharged to:: Private residence Living Arrangements: Alone   Type of Home: House Home Access: Ramped entrance     Home Layout: One level     Bathroom Shower/Tub: Chief Strategy OfficerTub/shower unit   Bathroom Toilet: Handicapped height  Home Equipment: None   Additional Comments: pt states her sister can obtain a tubseat for her likely      Prior Functioning/Environment Level of Independence: Independent             OT Diagnosis: Generalized weakness   OT Problem List: Decreased strength;Decreased knowledge of use of DME or AE   OT Treatment/Interventions: Self-care/ADL training;Patient/family education;Therapeutic activities;DME and/or AE instruction    OT Goals(Current goals can be found in the care plan section) Acute Rehab OT Goals Patient Stated Goal: home with assistance OT Goal Formulation: With patient Time For Goal Achievement: 05/18/14 Potential to Achieve Goals: Good  OT Frequency: Min 2X/week   Barriers to D/C:            Co-evaluation              End of  Session Equipment Utilized During Treatment: Gait belt;Rolling walker  Activity Tolerance: Patient tolerated treatment well Patient left: in chair;with call bell/phone within reach   Time: 1101-1128 OT Time Calculation (min): 27 min Charges:  OT General Charges $OT Visit: 1 Procedure OT Evaluation $Initial OT Evaluation Tier I: 1 Procedure OT Treatments $Self Care/Home Management : 8-22 mins $Therapeutic Activity: 8-22 mins G-Codes:    Lennox Laity [phone] 05/11/2014, 11:41 AM

## 2014-05-12 LAB — CBC
HCT: 30.1 % — ABNORMAL LOW (ref 36.0–46.0)
HEMOGLOBIN: 10.4 g/dL — AB (ref 12.0–15.0)
MCH: 29.3 pg (ref 26.0–34.0)
MCHC: 34.6 g/dL (ref 30.0–36.0)
MCV: 84.8 fL (ref 78.0–100.0)
PLATELETS: 268 10*3/uL (ref 150–400)
RBC: 3.55 MIL/uL — ABNORMAL LOW (ref 3.87–5.11)
RDW: 13.9 % (ref 11.5–15.5)
WBC: 14.6 10*3/uL — ABNORMAL HIGH (ref 4.0–10.5)

## 2014-05-12 LAB — BASIC METABOLIC PANEL
Anion gap: 12 (ref 5–15)
BUN: 14 mg/dL (ref 6–23)
CALCIUM: 8.9 mg/dL (ref 8.4–10.5)
CO2: 20 mEq/L (ref 19–32)
CREATININE: 0.59 mg/dL (ref 0.50–1.10)
Chloride: 106 mEq/L (ref 96–112)
GFR calc Af Amer: >90 mL/min (ref >90)
GLUCOSE: 252 mg/dL — AB (ref 70–99)
Potassium: 4 mEq/L (ref 3.7–5.3)
Sodium: 138 mEq/L (ref 137–147)

## 2014-05-12 LAB — GLUCOSE, CAPILLARY
Glucose-Capillary: 209 mg/dL — ABNORMAL HIGH (ref 70–99)
Glucose-Capillary: 122 mg/dL — ABNORMAL HIGH (ref 70–99)

## 2014-05-12 MED ORDER — METHOCARBAMOL 500 MG PO TABS: 500.0000 mg | ORAL_TABLET | Freq: Four times a day (QID) | ORAL | Status: AC | PRN

## 2014-05-12 MED ORDER — TRAMADOL HCL 50 MG PO TABS: 50.0000 mg | ORAL_TABLET | Freq: Four times a day (QID) | ORAL | Status: AC | PRN

## 2014-05-12 MED ORDER — RIVAROXABAN 10 MG PO TABS: 10.0000 mg | ORAL_TABLET | Freq: Every day | ORAL | Status: AC

## 2014-05-12 MED ORDER — OXYCODONE HCL 5 MG PO TABS: 5.0000 mg | ORAL_TABLET | ORAL | Status: AC | PRN

## 2014-05-12 NOTE — Progress Notes (Signed)
Occupational Therapy Treatment Patient Details Name: Stacey [name] MRN: [id number] DOB: [birth year] Today's Date: 05/12/2014    History of present illness 53 yo female s/p R THA-direct anterior 05/10/14. Hx of RA, HTN, DM, peripheral neuropathy.    OT comments  Pt doing well. Supposed to d/c and has assist at home available. Discussed and practiced safety with tub transfers and DME options. Educated on where to obtain a tubseat as she will need to sit once she steps into tub currently. She is unable to let go of wall of shower to wash so she will benefit from a seat to sit down and be able to wash. She verbalizes understanding of where to obtain DME and she wishes to look around on her own versus sponge bathe if she decides to not get a seat right away. Pt doing well.   Follow Up Recommendations  No OT follow up;Supervision/Assistance - 24 hour    Equipment Recommendations  3 in 1 bedside comode    Recommendations for Other Services      Precautions / Restrictions Precautions Precautions: Fall Restrictions Weight Bearing Restrictions: No RLE Weight Bearing: Weight bearing as tolerated       Mobility Bed Mobility                  Transfers Overall transfer level: Needs assistance Equipment used: Rolling walker (2 wheeled) Transfers: Sit to/from Stand Sit to Stand: Min guard              Balance                                   ADL                           Toilet Transfer: Min guard;Ambulation;RW       Tub/ Shower Transfer: Minimal assistance;Tub transfer;Rolling walker     General ADL Comments: Pt already dressed when OT arrived. SHe states she will have assist initially for all ADL and they can help with LB dressing. Reviewed sequence for LB dressing and to don clothes over LEs sitting down and then stand to pull up. Pt wanting to practice tub transfer so goal added for tub transfer. SHe states she was not able to borrow a tubseat so  educated on where to obtain a tubseat at [place] versus other friends/family versus the medical supply stores. Pt verbalized understanding and states she feels she would need a seat to sit down once in tub. If she doesnt obtain a seat, she states she can sponge off longer until ready to stand in shower. She did well with stepping over side of tub wtih min assist and holding to wall like shower. She declines need to use  3in1 and states she feels comfortable with technique.      Vision                     Perception     Praxis      Cognition   Behavior During Therapy: [facility] for tasks assessed/performed Overall Cognitive Status: Within Functional Limits for tasks assessed                       Extremity/Trunk Assessment               Exercises     Shoulder Instructions  General Comments      Pertinent Vitals/ Pain       5/10 R hip at start of session and with activity. Reposition, ice  Home Living                                          Prior Functioning/Environment              Frequency Min 2X/week     Progress Toward Goals  OT Goals(current goals can now be found in the care plan section)  Progress towards OT goals: Progressing toward goals (goal added for tub transfer)     Plan Discharge plan remains appropriate    Co-evaluation                 End of Session Equipment Utilized During Treatment: Rolling walker   Activity Tolerance Patient tolerated treatment well   Patient Left in chair;with call bell/phone within reach   Nurse Communication          Time: [id number] OT Time Calculation (min): 10 min  Charges: OT General Charges $OT Visit: 1 Procedure OT Treatments $Therapeutic Activity: 8-22 mins  Lennox LaityStone, Teren Franckowiak [place] [phone] 05/12/2014, 9:09 AM

## 2014-05-12 NOTE — Progress Notes (Signed)
   Subjective: 2 Days Post-Op Procedure(s) (LRB): RIGHT TOTAL HIP ARTHROPLASTY ANTERIOR APPROACH (Right) Patient reports pain as mild.   Patient seen in rounds with Dr. Lequita HaltAluisio. Patient is well, and has had no acute complaints or problems Patient is ready to go home  Objective: Vital signs in last 24 hours: Temp:  [97.6 F (36.4 C)-98.4 F (36.9 C)] 98.1 F (36.7 C) (07/31 0500) Pulse Rate:  [54-72] 72 (07/31 0500) Resp:  [16-18] 18 (07/31 0500) BP: (115-149)/(72-81) 149/81 mmHg (07/31 0500) SpO2:  [97 %-100 %] 99 % (07/31 0500)  Intake/Output from previous day:  Intake/Output Summary (Last 24 hours) at 05/12/14 0906 Last data filed at 05/12/14 0850  Gross per 24 hour  Intake    960 ml  Output   2050 ml  Net  -1090 ml    Intake/Output this shift: Total I/O In: 240 [P.O.:240] Out: -   Labs:  Recent Labs  05/11/14 0443 05/12/14 0500  HGB 10.5* 10.4*    Recent Labs  05/11/14 0443 05/12/14 0500  WBC 10.4 14.6*  RBC 3.54* 3.55*  HCT 29.9* 30.1*  PLT 275 268    Recent Labs  05/11/14 0443 05/12/14 0500  NA 137 138  K 4.1 4.0  CL 105 106  CO2 20 20  BUN 10 14  CREATININE 0.66 0.59  GLUCOSE 273* 252*  CALCIUM 8.5 8.9   No results found for this basename: LABPT, INR,  in the last 72 hours  EXAM: General - Patient is Alert, Appropriate and Oriented Extremity - Neurovascular intact Sensation intact distally Incision - clean, dry, no drainage Motor Function - intact, moving foot and toes well on exam.   Assessment/Plan: 2 Days Post-Op Procedure(s) (LRB): RIGHT TOTAL HIP ARTHROPLASTY ANTERIOR APPROACH (Right) Procedure(s) (LRB): RIGHT TOTAL HIP ARTHROPLASTY ANTERIOR APPROACH (Right) Past Medical History  Diagnosis Date  . Hypertension   . Diabetes mellitus without complication   . Arthritis   . GERD (gastroesophageal reflux disease)     hx of    Principal Problem:   OA (osteoarthritis) of hip Active Problems:   Postoperative anemia due to  acute blood loss   Unspecified essential hypertension   GERD (gastroesophageal reflux disease)   Diabetes  Estimated body mass index is 33.67 kg/(m^2) as calculated from the following:   Height as of this encounter: 5\' 3"  (1.6 m).   Weight as of this encounter: 86.183 kg (190 lb). Discharge home with home health Diet - Cardiac diet and Diabetic diet Follow up - in 2 weeks Activity - WBAT Disposition - Home Condition Upon Discharge - Good D/C Meds - See DC Summary DVT Prophylaxis - Xarelto  Avel Peacerew Para Cossey, PA-C Orthopaedic Surgery 05/12/2014, 9:06 AM

## 2014-05-12 NOTE — Plan of Care (Signed)
Problem: Phase III Progression Outcomes Goal: Anticoagulant follow-up in place Outcome: Not Applicable Date Met:  54/00/86 Xarelto VTE, no f/u needed.

## 2014-05-12 NOTE — Discharge Summary (Signed)
Physician Discharge Summary   Patient ID: Stacey [name] MRN: [id number] DOB/AGE: 01-27-1961 53 y.o.  Admit date: 05/10/2014 Discharge date: 05/12/2014  Primary Diagnosis:  Osteoarthritis of the Right hip.   Admission Diagnoses:  Past Medical History  Diagnosis Date  . Hypertension   . Diabetes mellitus without complication   . Arthritis   . GERD (gastroesophageal reflux disease)     hx of    Discharge Diagnoses:   Principal Problem:   OA (osteoarthritis) of hip Active Problems:   Postoperative anemia due to acute blood loss   Unspecified essential hypertension   GERD (gastroesophageal reflux disease)   Diabetes  Estimated body mass index is 33.67 kg/(m^2) as calculated from the following:   Height as of this encounter: $RemoveBeforeD'5\' 3"'[id number]$  (1.6 m).   Weight as of this encounter: 86.183 kg (190 lb).  Procedure(s) (LRB): RIGHT TOTAL HIP ARTHROPLASTY ANTERIOR APPROACH (Right)   Consults: None  HPI: Stacey [name] is a 53 y.o. female who has advanced end-  stage arthritis of his Right hip with progressively worsening pain and  dysfunction.The patient has failed nonoperative management and presents for  total hip arthroplasty.  Laboratory Data: Admission on 05/10/2014, Discharged on 05/12/2014  Component Date Value Ref Range Status  . ABO/RH(D) 05/10/2014 B POS   Final  . Antibody Screen 05/10/2014 NEG   Final  . Sample Expiration 05/10/2014 05/13/2014   Final  . Glucose-Capillary 05/10/2014 214* 70 - 99 mg/dL Final  . Comment 1 05/10/2014 Documented in Chart   Final  . ABO/RH(D) 05/10/2014 B POS   Final  . Glucose-Capillary 05/10/2014 113* 70 - 99 mg/dL Final  . Comment 1 05/10/2014 Documented in Chart   Final  . Comment 2 05/10/2014 Notify RN   Final  . Glucose-Capillary 05/10/2014 219* 70 - 99 mg/dL Final  . Comment 1 05/10/2014 Notify RN   Final  . WBC 05/11/2014 10.4  4.0 - 10.5 K/uL Final  . RBC 05/11/2014 3.54* 3.87 - 5.11 MIL/uL Final  . Hemoglobin 05/11/2014 10.5* 12.0 - 15.0  g/dL Final  . HCT 05/11/2014 29.9* 36.0 - 46.0 % Final  . MCV 05/11/2014 84.5  78.0 - 100.0 fL Final  . MCH 05/11/2014 29.7  26.0 - 34.0 pg Final  . MCHC 05/11/2014 35.1  30.0 - 36.0 g/dL Final  . RDW 05/11/2014 13.7  11.5 - 15.5 % Final  . Platelets 05/11/2014 275  150 - 400 K/uL Final  . Sodium 05/11/2014 137  137 - 147 mEq/L Final  . Potassium 05/11/2014 4.1  3.7 - 5.3 mEq/L Final  . Chloride 05/11/2014 105  96 - 112 mEq/L Final  . CO2 05/11/2014 20  19 - 32 mEq/L Final  . Glucose, Bld 05/11/2014 273* 70 - 99 mg/dL Final  . BUN 05/11/2014 10  6 - 23 mg/dL Final  . Creatinine, Ser 05/11/2014 0.66  0.50 - 1.10 mg/dL Final  . Calcium 05/11/2014 8.5  8.4 - 10.5 mg/dL Final  . GFR calc non Af Amer 05/11/2014 >90  >90 mL/min Final  . GFR calc Af Amer 05/11/2014 >90  >90 mL/min Final   Comment: (NOTE)                          The eGFR has been calculated using the CKD EPI equation.                          This calculation has not  been validated in all clinical situations.                          eGFR's persistently <90 mL/min signify possible Chronic Kidney                          Disease.  . Anion gap 05/11/2014 12  5 - 15 Final  . Glucose-Capillary 05/10/2014 314* 70 - 99 mg/dL Final  . Glucose-Capillary 05/11/2014 229* 70 - 99 mg/dL Final  . Glucose-Capillary 05/11/2014 217* 70 - 99 mg/dL Final  . Glucose-Capillary 05/11/2014 176* 70 - 99 mg/dL Final  . WBC 05/12/2014 14.6* 4.0 - 10.5 K/uL Final  . RBC 05/12/2014 3.55* 3.87 - 5.11 MIL/uL Final  . Hemoglobin 05/12/2014 10.4* 12.0 - 15.0 g/dL Final  . HCT 05/12/2014 30.1* 36.0 - 46.0 % Final  . MCV 05/12/2014 84.8  78.0 - 100.0 fL Final  . MCH 05/12/2014 29.3  26.0 - 34.0 pg Final  . MCHC 05/12/2014 34.6  30.0 - 36.0 g/dL Final  . RDW 05/12/2014 13.9  11.5 - 15.5 % Final  . Platelets 05/12/2014 268  150 - 400 K/uL Final  . Sodium 05/12/2014 138  137 - 147 mEq/L Final  . Potassium 05/12/2014 4.0  3.7 - 5.3 mEq/L Final  . Chloride  05/12/2014 106  96 - 112 mEq/L Final  . CO2 05/12/2014 20  19 - 32 mEq/L Final  . Glucose, Bld 05/12/2014 252* 70 - 99 mg/dL Final  . BUN 05/12/2014 14  6 - 23 mg/dL Final  . Creatinine, Ser 05/12/2014 0.59  0.50 - 1.10 mg/dL Final  . Calcium 05/12/2014 8.9  8.4 - 10.5 mg/dL Final  . GFR calc non Af Amer 05/12/2014 >90  >90 mL/min Final  . GFR calc Af Amer 05/12/2014 >90  >90 mL/min Final   Comment: (NOTE)                          The eGFR has been calculated using the CKD EPI equation.                          This calculation has not been validated in all clinical situations.                          eGFR's persistently <90 mL/min signify possible Chronic Kidney                          Disease.  . Anion gap 05/12/2014 12  5 - 15 Final  . Glucose-Capillary 05/11/2014 208* 70 - 99 mg/dL Final  . Glucose-Capillary 05/12/2014 209* 70 - 99 mg/dL Final  . Glucose-Capillary 05/12/2014 122* 70 - 99 mg/dL Final  Hospital Outpatient Visit on 05/03/2014  Component Date Value Ref Range Status  . aPTT 05/03/2014 26  24 - 37 seconds Final  . WBC 05/03/2014 6.9  4.0 - 10.5 K/uL Final  . RBC 05/03/2014 4.31  3.87 - 5.11 MIL/uL Final  . Hemoglobin 05/03/2014 12.8  12.0 - 15.0 g/dL Final  . HCT 05/03/2014 37.0  36.0 - 46.0 % Final  . MCV 05/03/2014 85.8  78.0 - 100.0 fL Final  . MCH 05/03/2014 29.7  26.0 - 34.0 pg Final  . MCHC 05/03/2014 34.6  30.0 - 36.0 g/dL  Final  . RDW 05/03/2014 14.0  11.5 - 15.5 % Final  . Platelets 05/03/2014 341  150 - 400 K/uL Final  . Sodium 05/03/2014 139  137 - 147 mEq/L Final  . Potassium 05/03/2014 3.8  3.7 - 5.3 mEq/L Final  . Chloride 05/03/2014 102  96 - 112 mEq/L Final  . CO2 05/03/2014 24  19 - 32 mEq/L Final  . Glucose, Bld 05/03/2014 197* 70 - 99 mg/dL Final  . BUN 05/03/2014 10  6 - 23 mg/dL Final  . Creatinine, Ser 05/03/2014 0.68  0.50 - 1.10 mg/dL Final  . Calcium 05/03/2014 9.5  8.4 - 10.5 mg/dL Final  . Total Protein 05/03/2014 8.6* 6.0 - 8.3 g/dL  Final  . Albumin 05/03/2014 3.7  3.5 - 5.2 g/dL Final  . AST 05/03/2014 14  0 - 37 U/L Final  . ALT 05/03/2014 9  0 - 35 U/L Final  . Alkaline Phosphatase 05/03/2014 169* 39 - 117 U/L Final  . Total Bilirubin 05/03/2014 0.3  0.3 - 1.2 mg/dL Final  . GFR calc non Af Amer 05/03/2014 >90  >90 mL/min Final  . GFR calc Af Amer 05/03/2014 >90  >90 mL/min Final   Comment: (NOTE)                          The eGFR has been calculated using the CKD EPI equation.                          This calculation has not been validated in all clinical situations.                          eGFR's persistently <90 mL/min signify possible Chronic Kidney                          Disease.  . Anion gap 05/03/2014 13  5 - 15 Final  . Prothrombin Time 05/03/2014 13.5  11.6 - 15.2 seconds Final  . INR 05/03/2014 1.03  0.00 - 1.49 Final  . Color, Urine 05/03/2014 YELLOW  YELLOW Final  . APPearance 05/03/2014 CLEAR  CLEAR Final  . Specific Gravity, Urine 05/03/2014 1.025  1.005 - 1.030 Final  . pH 05/03/2014 7.0  5.0 - 8.0 Final  . Glucose, UA 05/03/2014 100* NEGATIVE mg/dL Final  . Hgb urine dipstick 05/03/2014 NEGATIVE  NEGATIVE Final  . Bilirubin Urine 05/03/2014 NEGATIVE  NEGATIVE Final  . Ketones, ur 05/03/2014 NEGATIVE  NEGATIVE mg/dL Final  . Protein, ur 05/03/2014 30* NEGATIVE mg/dL Final  . Urobilinogen, UA 05/03/2014 0.2  0.0 - 1.0 mg/dL Final  . Nitrite 05/03/2014 NEGATIVE  NEGATIVE Final  . Leukocytes, UA 05/03/2014 NEGATIVE  NEGATIVE Final  . MRSA, PCR 05/03/2014 NEGATIVE  NEGATIVE Final  . Staphylococcus aureus 05/03/2014 POSITIVE* NEGATIVE Final   Comment:                                 The Xpert SA Assay (FDA                          approved for NASAL specimens                          in patients over  90 years of age),                          is one component of                          a comprehensive surveillance                          program.  Test performance has                           been validated by [employer] for patients greater                          than or equal to 44 year old.                          It is not intended                          to diagnose infection nor to                          guide or monitor treatment.  . Squamous Epithelial / LPF 05/03/2014 RARE  RARE Final  . WBC, UA 05/03/2014 0-2  <3 WBC/hpf Final  . RBC / HPF 05/03/2014 0-2  <3 RBC/hpf Final     X-Rays:Dg Chest 2 View  05/03/2014   CLINICAL DATA:  Smoker.  EXAM: CHEST  2 VIEW  COMPARISON:  None.  FINDINGS: Mediastinum and hilar structures normal. Lungs are clear. Heart size normal. No pleural effusion or pneumothorax. Degenerative changes thoracic spine and both shoulders. Surgical clips right upper quadrant.  IMPRESSION: No acute abnormality.   Electronically Signed   By: Marcello Moores  Register   On: 05/03/2014 09:52   Dg Hip Complete Right  05/03/2014   CLINICAL DATA:  Preoperative exam prior to right hip replacement  EXAM: RIGHT HIP - COMPLETE 2+ VIEW  COMPARISON:  None.  FINDINGS: There is severe degenerative change of the right hip joint with loss of the joint space and eburnation of the articular surfaces. There are mild degenerative changes of the left hip. The bony pelvis is intact. The patient has undergone previous lower lumbar fusion.  IMPRESSION: There is severe degenerative change of the right hip.   Electronically Signed   By: David  Martinique   On: 05/03/2014 09:54   Dg Pelvis Portable  05/10/2014   CLINICAL DATA:  Status post right total hip arthroplasty.  EXAM: PORTABLE PELVIS 1-2 VIEWS  COMPARISON:  None.  FINDINGS: The right femoral and acetabular components appear to be well situated. No fracture dislocation is noted. Left hip joint appears grossly normal. Status post surgical fusion of lower lumbar spine.  IMPRESSION: Status post right total hip arthroplasty.   Electronically Signed   By: Sabino Dick M.D.   On: 05/10/2014 14:32   Dg C-arm 1-60  Min-no Report  05/10/2014   CLINICAL DATA: right anterior hip   C-ARM 1-60 MINUTES  Fluoroscopy was utilized by the requesting physician.  No radiographic  interpretation.     EKG: Orders placed during the hospital encounter of 05/03/14  . EKG 12-LEAD  . EKG 12-LEAD     Hospital Course: Patient was admitted to [facility] and taken to the OR and underwent the above state procedure without complications.  Patient tolerated the procedure well and was later transferred to the recovery room and then to the orthopaedic floor for postoperative care.  They were given PO and IV analgesics for pain control following their surgery.  They were given 24 hours of postoperative antibiotics of  Anti-infectives   Start     Dose/Rate Route Frequency Ordered Stop   05/10/14 1830  ceFAZolin (ANCEF) IVPB 2 g/50 mL premix     2 g 100 mL/hr over 30 Minutes Intravenous Every 6 hours 05/10/14 1549 05/11/14 0012   05/10/14 0937  ceFAZolin (ANCEF) IVPB 2 g/50 mL premix     2 g 100 mL/hr over 30 Minutes Intravenous On call to O.R. 05/10/14 2706 05/10/14 1232     and started on DVT prophylaxis in the form of Xarelto.   PT and OT were ordered for total hip protocol.  The patient was allowed to be WBAT with therapy. Discharge planning was consulted to help with postop disposition and equipment needs.  Patient had a tough night on the evening of surgery but better the next morning.  They started to get up OOB with therapy on day one.  Hemovac drain was pulled without difficulty.  Continued to work with therapy into day two.  Dressing was changed on day two and the incision was healing well.  Patient was seen in rounds and was ready to go home.  Discharge home with home health  Diet - Cardiac diet and Diabetic diet  Follow up - in 2 weeks  Activity - WBAT  Disposition - Home  Condition Upon Discharge - Good  D/C Meds - See DC Summary  DVT Prophylaxis - Xarelto       Discharge Instructions   Call MD /  Call 911    Complete by:  As directed   If you experience chest pain or shortness of breath, CALL 911 and be transported to the hospital emergency room.  If you develope a fever above 101 F, pus (white drainage) or increased drainage or redness at the wound, or calf pain, call your surgeon's office.     Change dressing    Complete by:  As directed   You may change your dressing dressing daily with sterile 4 x 4 inch gauze dressing and paper tape.  Do not submerge the incision under water.     Constipation Prevention    Complete by:  As directed   Drink plenty of fluids.  Prune juice may be helpful.  You may use a stool softener, such as Colace (over the counter) 100 mg twice a day.  Use MiraLax (over the counter) for constipation as needed.     Diet - low sodium heart healthy    Complete by:  As directed      Diet Carb Modified    Complete by:  As directed      Discharge instructions    Complete by:  As directed   Pick up stool softner and laxative for home. Do not submerge incision under water. May shower. Continue to use ice for pain and swelling from surgery.  Total Hip Protocol.  Take Xarelto for two and a half more weeks, then discontinue Xarelto. Once the patient  has completed the blood thinner regimen, then take a Baby 81 mg Aspirin daily for three more weeks.     Do not sit on low chairs, stoools or toilet seats, as it may be difficult to get up from low surfaces    Complete by:  As directed      Driving restrictions    Complete by:  As directed   No driving until released by the physician.     Increase activity slowly as tolerated    Complete by:  As directed      Lifting restrictions    Complete by:  As directed   No lifting until released by the physician.     Patient may shower    Complete by:  As directed   You may shower without a dressing once there is no drainage.  Do not wash over the wound.  If drainage remains, do not shower until drainage stops.     TED hose     Complete by:  As directed   Use stockings (TED hose) for 3 weeks on both leg(s).  You may remove them at night for sleeping.     Weight bearing as tolerated    Complete by:  As directed             Medication List    STOP taking these medications       aspirin EC 81 MG tablet     ibuprofen 200 MG tablet  Commonly known as:  ADVIL,MOTRIN      TAKE these medications       glipiZIDE 5 MG tablet  Commonly known as:  GLUCOTROL  Take 5 mg by mouth daily before breakfast.     magnesium oxide 400 MG tablet  Commonly known as:  MAG-OX  Take 800 mg by mouth 2 (two) times daily.     metFORMIN 500 MG tablet  Commonly known as:  GLUCOPHAGE  Take 500 mg by mouth daily with breakfast.     methocarbamol 500 MG tablet  Commonly known as:  ROBAXIN  Take 1 tablet (500 mg total) by mouth every 6 (six) hours as needed for muscle spasms.     oxyCODONE 5 MG immediate release tablet  Commonly known as:  Oxy IR/ROXICODONE  Take 1-2 tablets (5-10 mg total) by mouth every 3 (three) hours as needed for moderate pain, severe pain or breakthrough pain.     potassium gluconate 595 MG Tabs tablet  Take 1,190 mg by mouth 2 (two) times daily.     quinapril 5 MG tablet  Commonly known as:  ACCUPRIL  Take 5 mg by mouth every morning.     rivaroxaban 10 MG Tabs tablet  Commonly known as:  XARELTO  - Take 1 tablet (10 mg total) by mouth daily with breakfast. Take Xarelto for two and a half more weeks, then discontinue Xarelto.  - Once the patient has completed the Xarelto, they may resume the 81 mg Aspirin.     simvastatin 20 MG tablet  Commonly known as:  ZOCOR  Take 20 mg by mouth daily.     traMADol 50 MG tablet  Commonly known as:  ULTRAM  Take 1-2 tablets (50-100 mg total) by mouth every 6 (six) hours as needed (mild pain).       Follow-up Information   Follow up with Gearlean Alf, MD. Schedule an appointment as soon as possible for a visit on 05/23/2014.   Specialty:  Orthopedic  Surgery   Contact  information:   [address] [place] [address] [phone]       Follow up with [facility].   Specialty:  Home Health Services   Contact information:   [address] [place] [address] [phone]       Signed: Arlee Muslim, PA-C Orthopaedic Surgery 05/30/2014, 1:53 PM

## 2014-05-12 NOTE — Progress Notes (Signed)
Physical Therapy Treatment Patient Details Name: Stacey [name] MRN: [id number] DOB: 08-04-1961 Today's Date: 05/12/2014    History of Present Illness 53 yo female s/p R THA-direct anterior 05/10/14. Hx of RA, HTN, DM, peripheral neuropathy.     PT Comments    Progressing well with mobility. Reviewed/practiced ambulation, exercises.Discussed car transfer. All education completed. Ready to d/c from PT standpoint.   Follow Up Recommendations  Home health PT;Supervision/Assistance - 24 hour     Equipment Recommendations  Rolling walker with 5" wheels    Recommendations for Other Services OT consult     Precautions / Restrictions Precautions Precautions: Fall Restrictions Weight Bearing Restrictions: No RLE Weight Bearing: Weight bearing as tolerated    Mobility  Bed Mobility                  Transfers Overall transfer level: Needs assistance Equipment used: Rolling walker (2 wheeled) Transfers: Sit to/from Stand Sit to Stand: Supervision         General transfer comment: VCS safety, hand placement  Ambulation/Gait Ambulation/Gait assistance: Min guard Ambulation Distance (Feet): 150 Feet Assistive device: Rolling walker (2 wheeled) Gait Pattern/deviations: Step-to pattern;Step-through pattern;Decreased stride length;Antalgic     General Gait Details: close guard for safety. tolerated well.    Stairs            Wheelchair Mobility    Modified Rankin (Stroke Patients Only)       Balance                                    Cognition Arousal/Alertness: Awake/alert Behavior During Therapy: WFL for tasks assessed/performed Overall Cognitive Status: Within Functional Limits for tasks assessed                      Exercises Total Joint Exercises Ankle Circles/Pumps: AROM;Both;10 reps;Supine Quad Sets: AROM;Both;10 reps;Supine Heel Slides: AAROM;Right;10 reps;Supine Hip ABduction/ADduction: AAROM;Right;10 reps;Supine     General Comments        Pertinent Vitals/Pain 5/10 R hip with activity. Ice applied end of session    Home Living                      Prior Function            PT Goals (current goals can now be found in the care plan section) Progress towards PT goals: Progressing toward goals    Frequency  7X/week    PT Plan Current plan remains appropriate    Co-evaluation             End of Session Equipment Utilized During Treatment: Gait belt Activity Tolerance: Patient tolerated treatment well Patient left: in chair;with call bell/phone within reach     Time: 0929-0939 PT Time Calculation (min): 10 min  Charges:  $Gait Training: 8-22 mins                    G Codes:      Stacey [name], MPT Pager: [phone]

## 2016-04-03 IMAGING — CR DG HIP COMPLETE 2+V*R*
3 series · 3 of 3 positions shown · non-contrast
Comparison: None.

CLINICAL DATA: Preoperative exam prior to right hip replacement

EXAM:
RIGHT HIP - COMPLETE 2+ VIEW

[t pelvis a.p.]
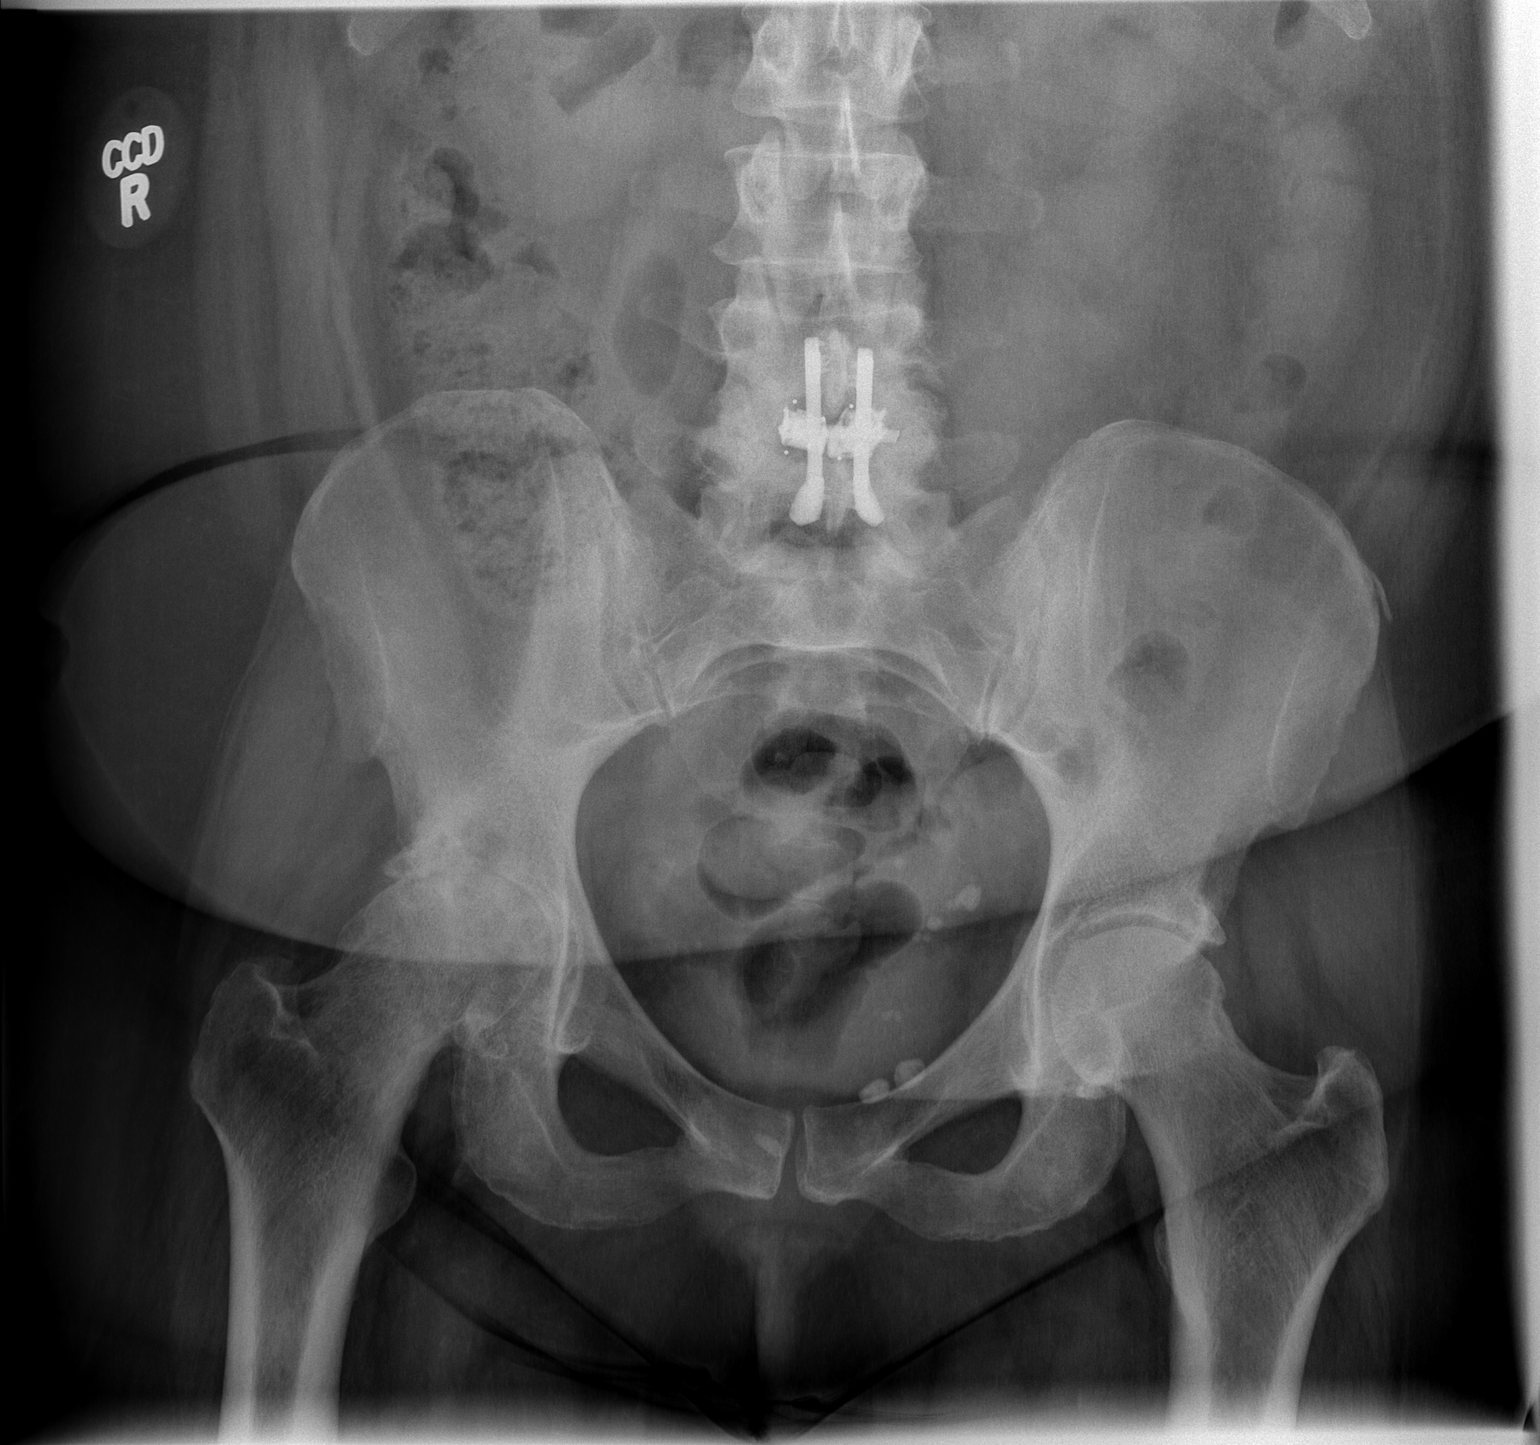

[t hip ap right]
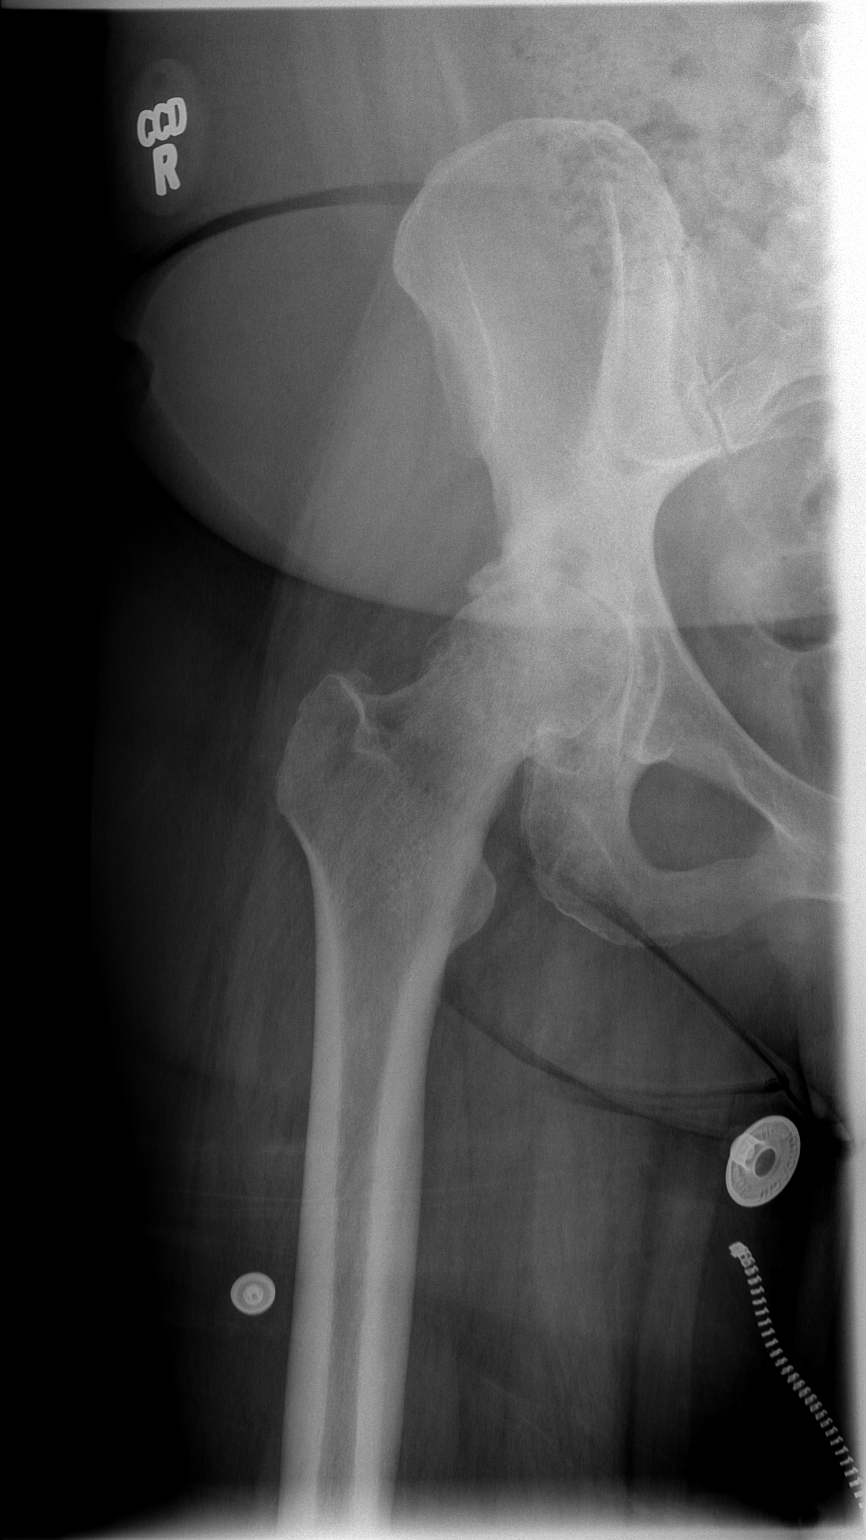

[t hip frog leg right]
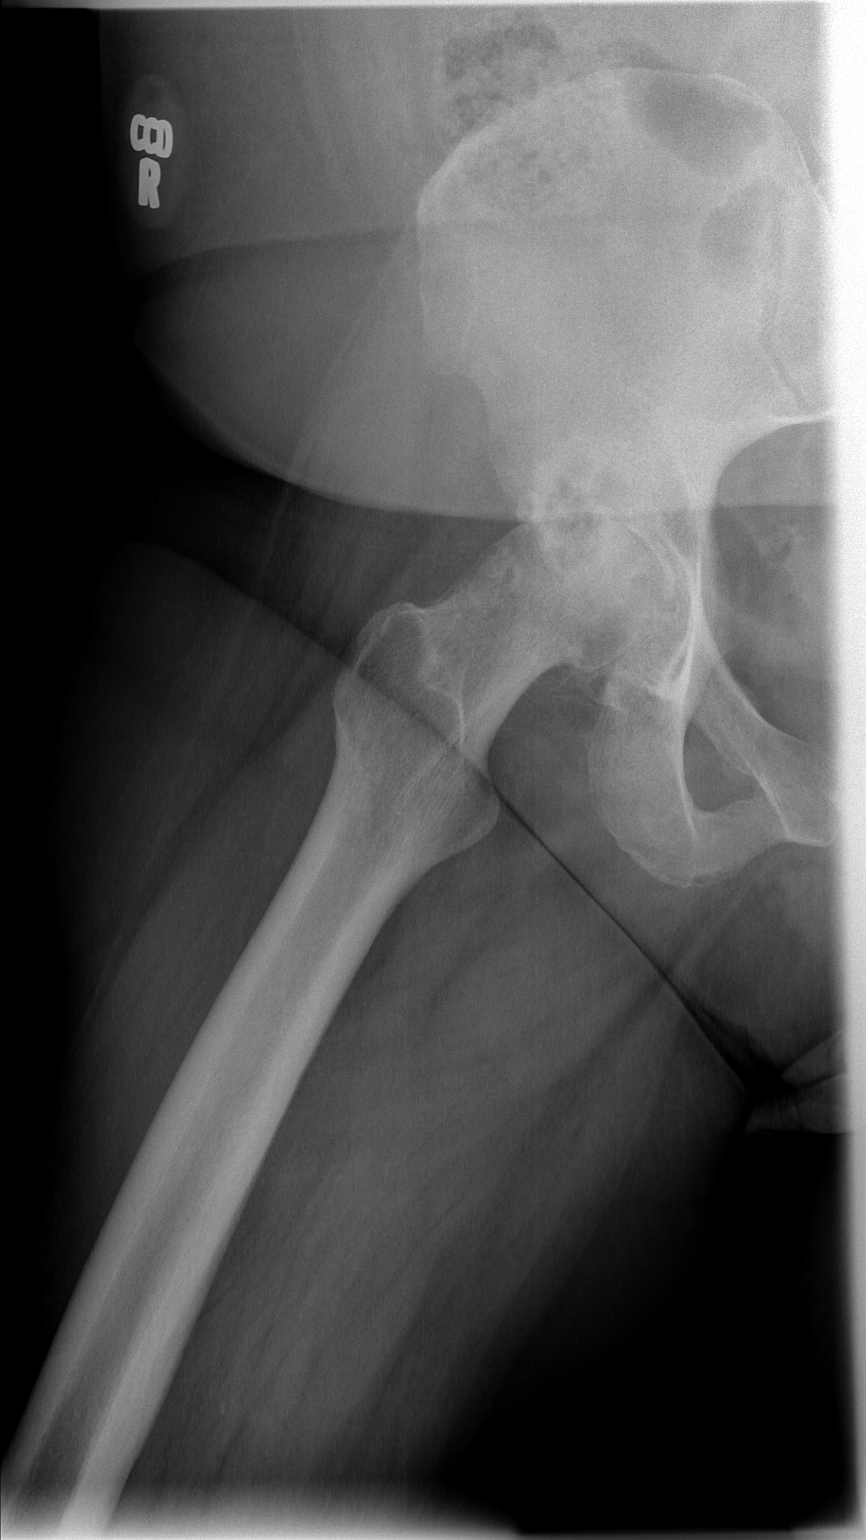

[3 of 3 positions shown; findings below may reference images not displayed]

FINDINGS: There is severe degenerative change of the right hip joint with loss
of the joint space and eburnation of the articular surfaces. There
are mild degenerative changes of the left hip. The bony pelvis is
intact. The patient has undergone previous lower lumbar fusion.
IMPRESSION: There is severe degenerative change of the right hip.

## 2016-04-03 IMAGING — CR DG CHEST 2V
2 series · 2 of 2 positions shown · non-contrast
Comparison: None.

CLINICAL DATA: Smoker.

EXAM:
CHEST  2 VIEW

[w chest pa]
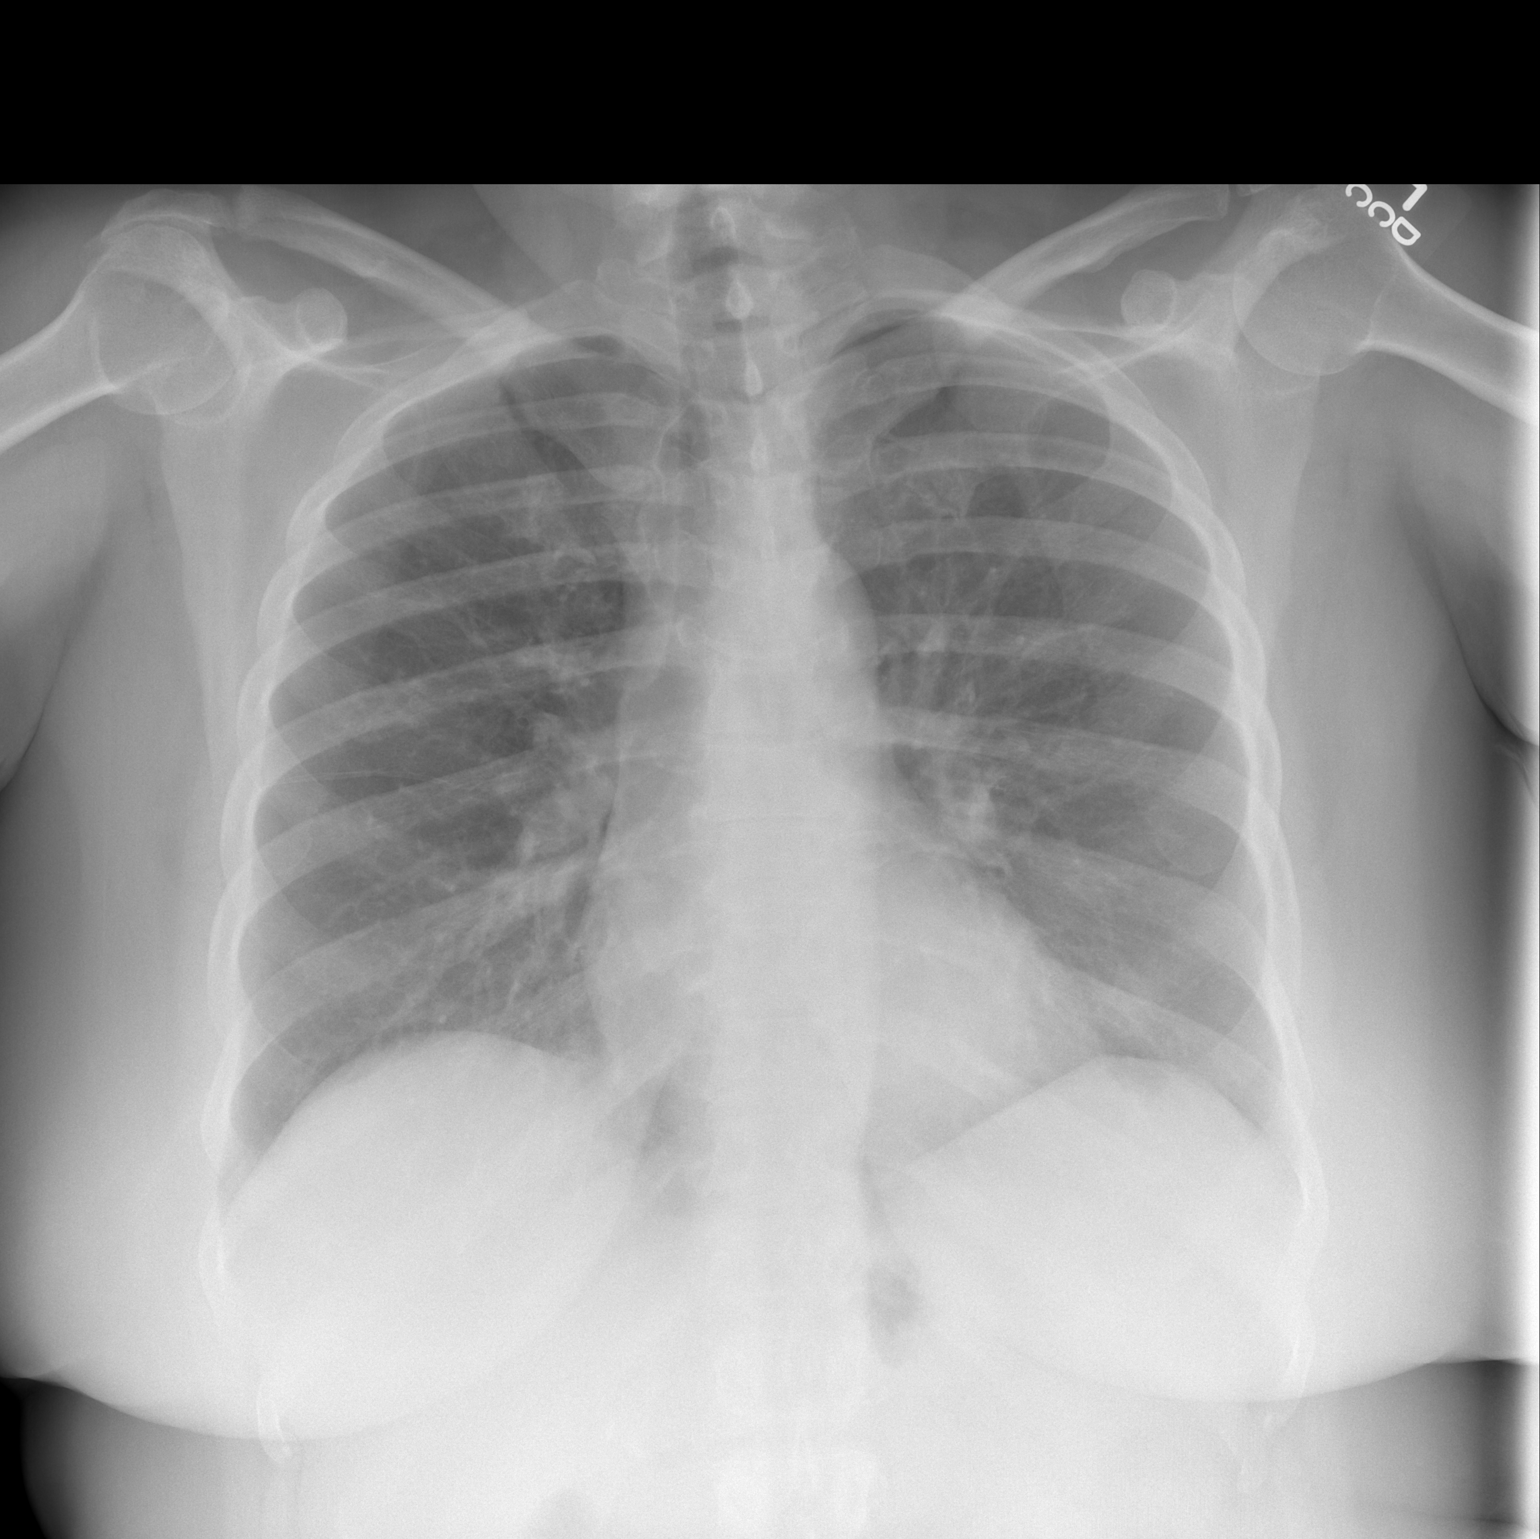

[w chest lat]
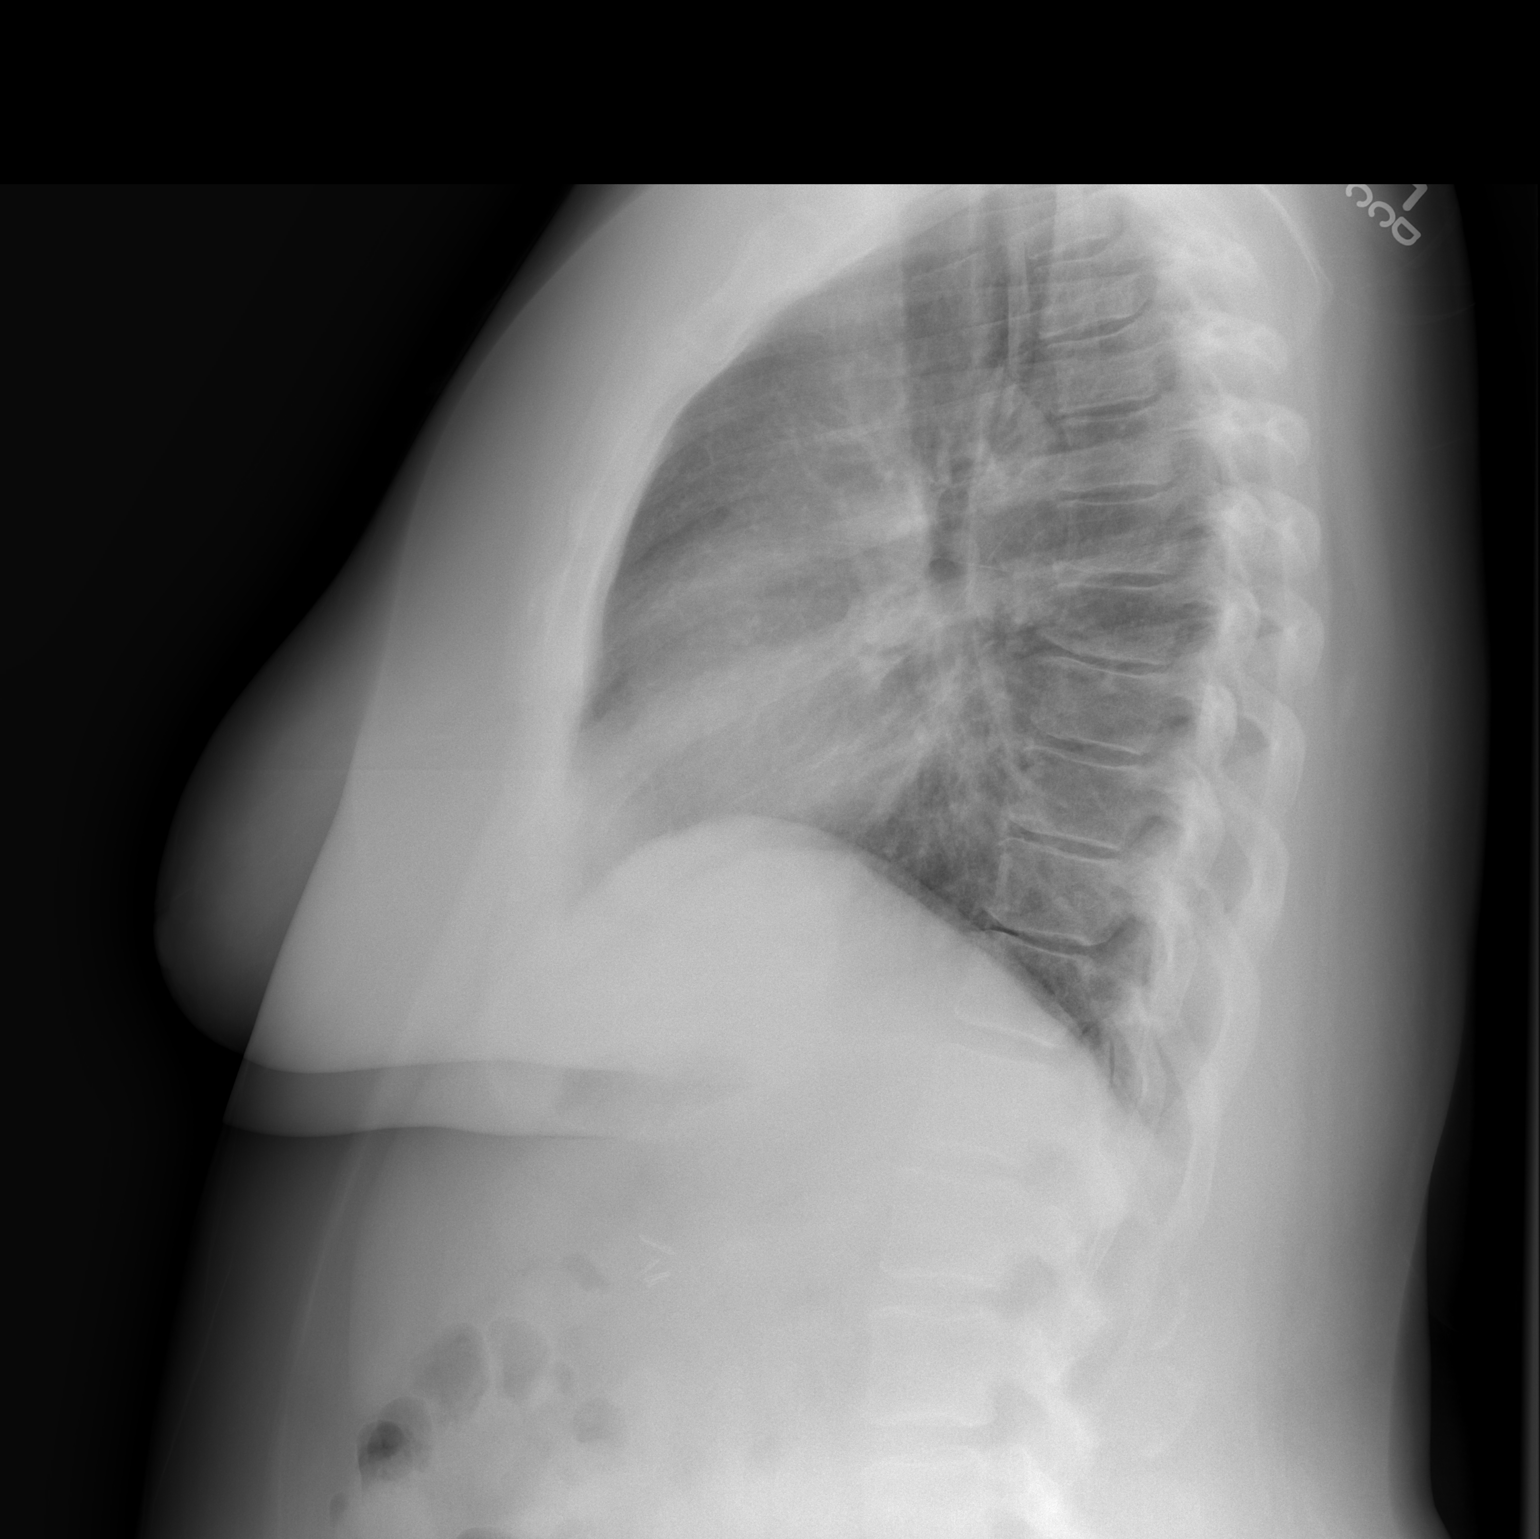

[2 of 2 positions shown; findings below may reference images not displayed]

FINDINGS: Mediastinum and hilar structures normal. Lungs are clear. Heart size
normal. No pleural effusion or pneumothorax. Degenerative changes
thoracic spine and both shoulders. Surgical clips right upper
quadrant.
IMPRESSION: No acute abnormality.

## 2023-11-14 DEATH — deceased
